# Patient Record
Sex: Male | Born: 1950 | Race: White | Hispanic: No | Marital: Single | Smoking: Former smoker
Health system: Southern US, Community
[De-identification: ages and names within clinical notes are randomized; demographics above are authoritative.]

## PROBLEM LIST (undated history)

## (undated) DIAGNOSIS — I639 Cerebral infarction, unspecified: Secondary | ICD-10-CM

## (undated) DIAGNOSIS — L02416 Cutaneous abscess of left lower limb: Secondary | ICD-10-CM

## (undated) DIAGNOSIS — E785 Hyperlipidemia, unspecified: Secondary | ICD-10-CM

## (undated) DIAGNOSIS — L03116 Cellulitis of left lower limb: Secondary | ICD-10-CM

## (undated) DIAGNOSIS — M109 Gout, unspecified: Secondary | ICD-10-CM

## (undated) DIAGNOSIS — I509 Heart failure, unspecified: Secondary | ICD-10-CM

## (undated) DIAGNOSIS — I2510 Atherosclerotic heart disease of native coronary artery without angina pectoris: Secondary | ICD-10-CM

## (undated) DIAGNOSIS — I10 Essential (primary) hypertension: Secondary | ICD-10-CM

## (undated) HISTORY — PX: CARDIAC CATHETERIZATION: SHX172

## (undated) HISTORY — PX: CLEFT LIP REPAIR: SUR1164

---

## 2003-12-01 ENCOUNTER — Emergency Department: Payer: Self-pay | Admitting: Emergency Medicine

## 2003-12-02 ENCOUNTER — Emergency Department: Payer: Self-pay | Admitting: Emergency Medicine

## 2003-12-02 ENCOUNTER — Other Ambulatory Visit: Payer: Self-pay

## 2003-12-21 ENCOUNTER — Emergency Department: Payer: Self-pay | Admitting: Emergency Medicine

## 2004-01-05 ENCOUNTER — Emergency Department: Payer: Self-pay | Admitting: Internal Medicine

## 2004-06-03 ENCOUNTER — Inpatient Hospital Stay: Payer: Self-pay | Admitting: Internal Medicine

## 2004-06-03 ENCOUNTER — Other Ambulatory Visit: Payer: Self-pay

## 2004-06-03 IMAGING — CT CT CHEST W/ CM
1 series · 15 of 33 positions shown, 19 images · IV contrast (APPLIED)
Comparison: none

REASON FOR EXAM: Chest pain
COMMENTS:

[Series 4: soft tissue · axial · 0.83mm/px · z∈[-400,-118]mm · 15 of 112 slices shown, 19 images]
[im 9/112  mediastinal]
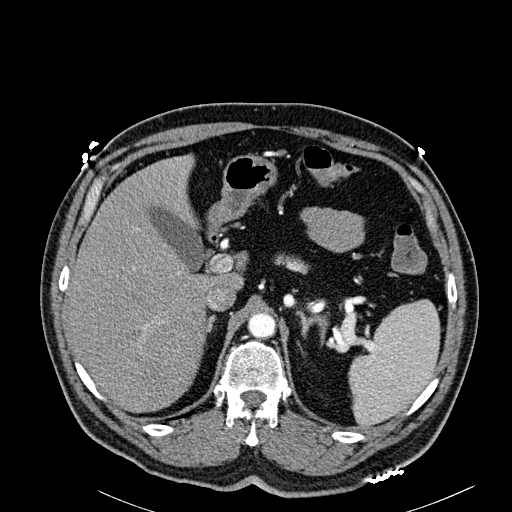
[im 9/112  lung]
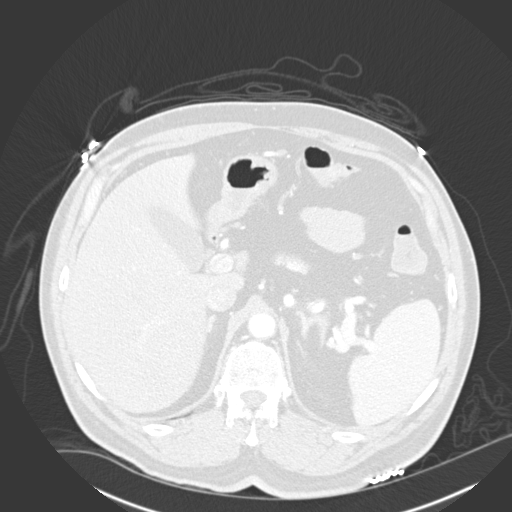
[im 17/112  lung]
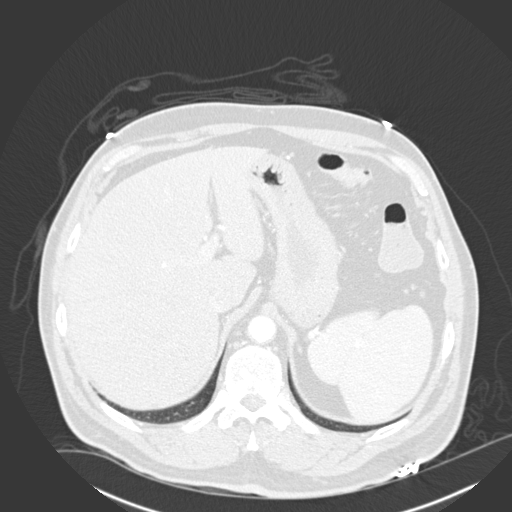
[im 23/112  lung]
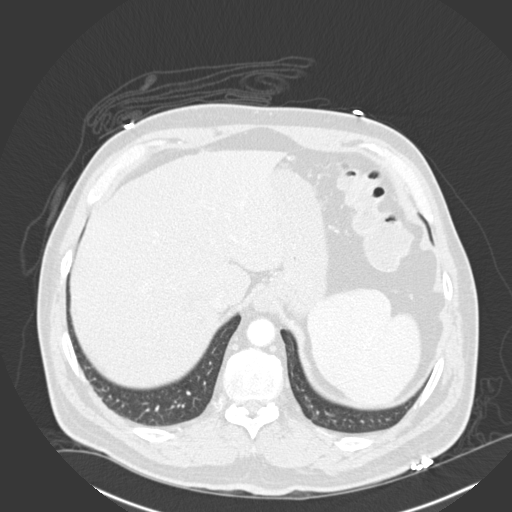
[im 29/112  lung]
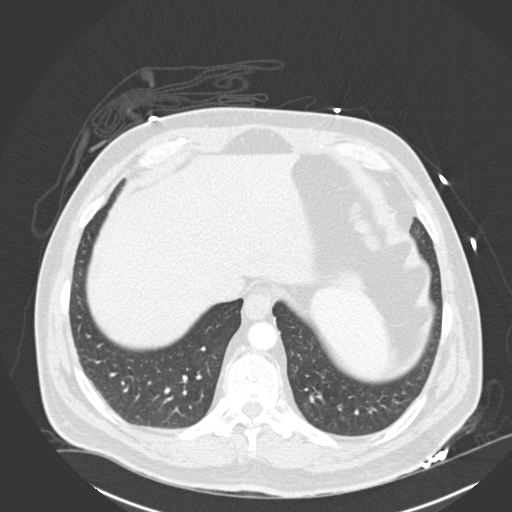
[im 38/112  mediastinal]
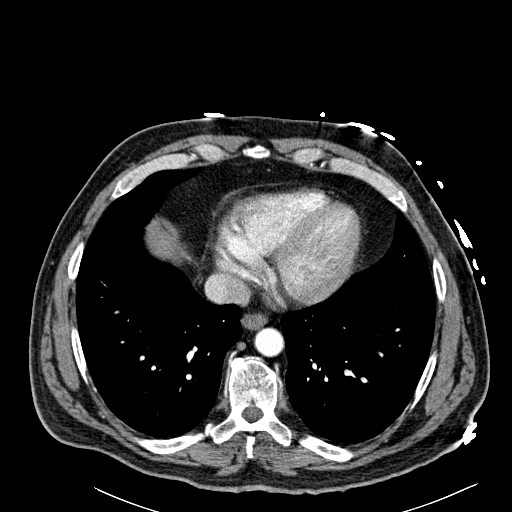
[im 38/112  lung]
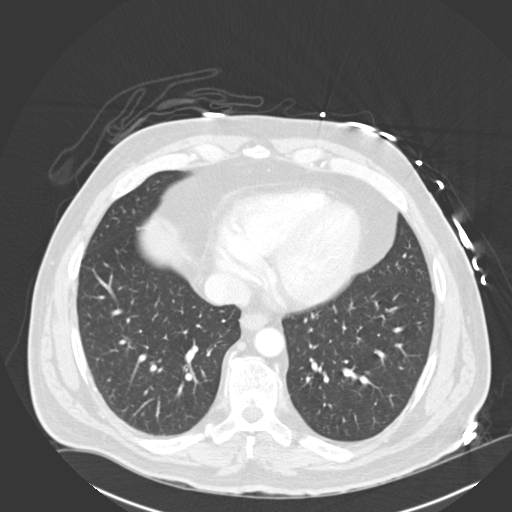
[im 45/112  lung]
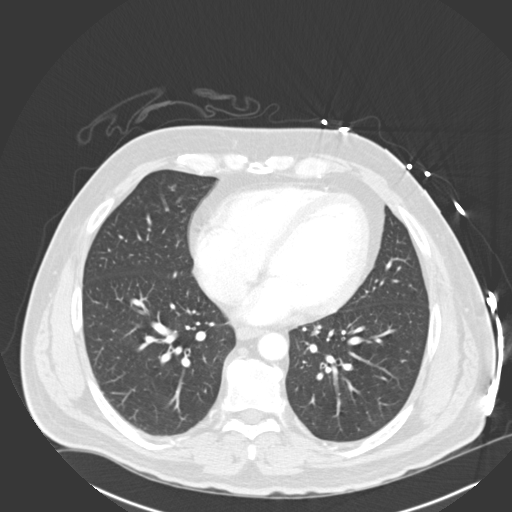
[im 50/112  lung]
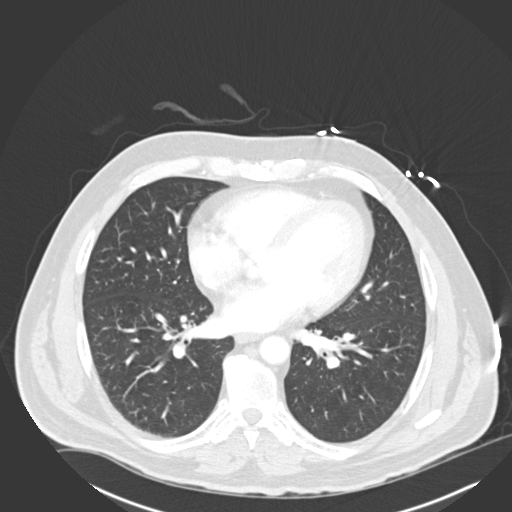
[im 58/112  lung]
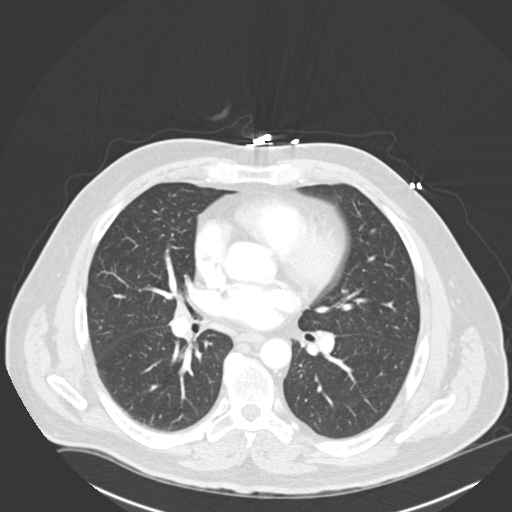
[im 62/112  mediastinal]
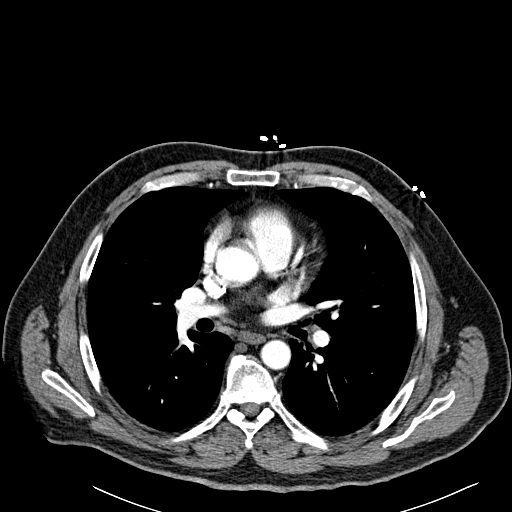
[im 62/112  lung]
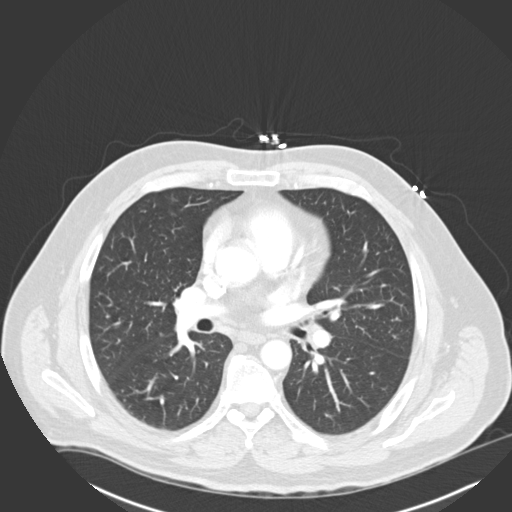
[im 67/112  lung]
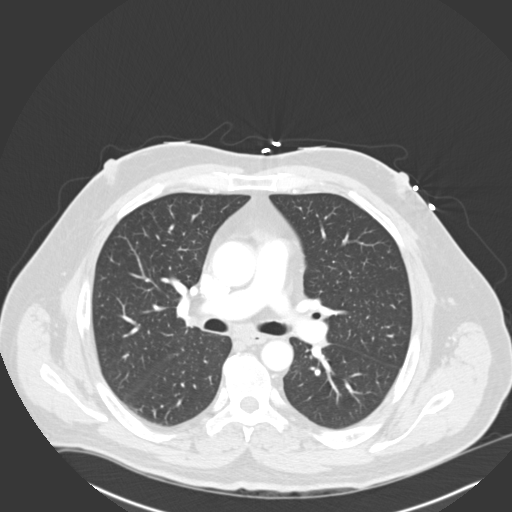
[im 75/112  lung]
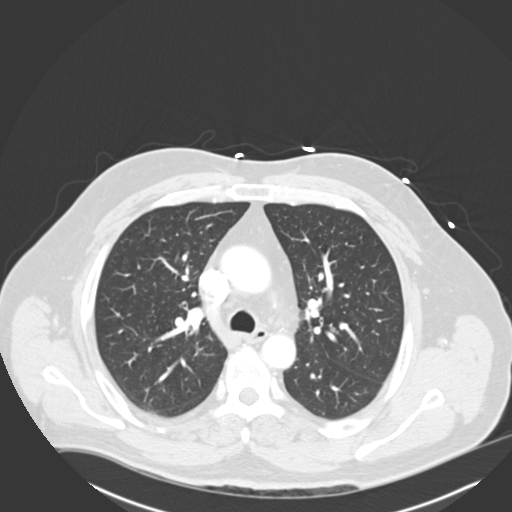
[im 83/112  lung]
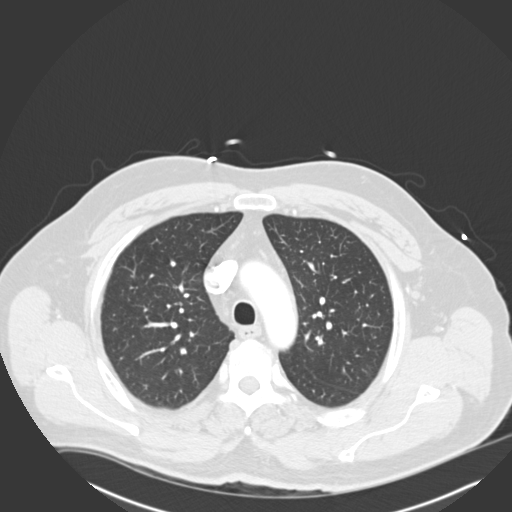
[im 89/112  mediastinal]
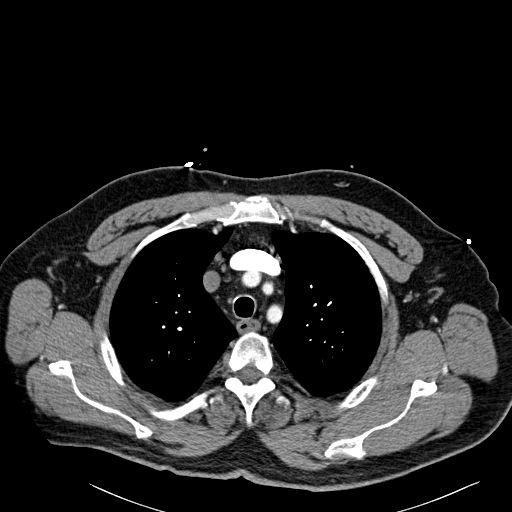
[im 89/112  lung]
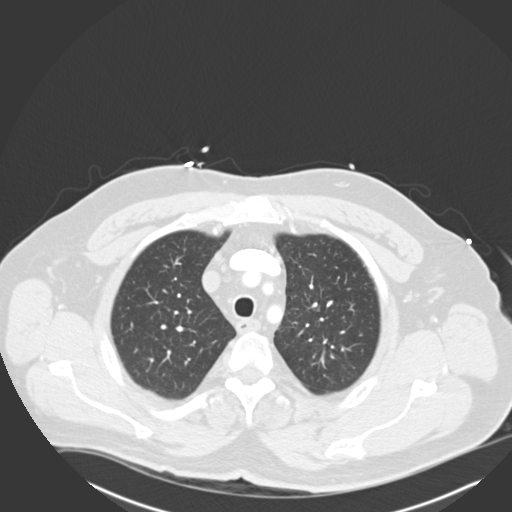
[im 95/112  lung]
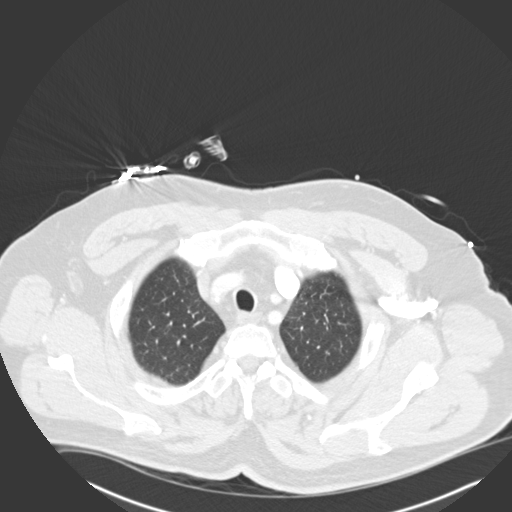
[im 103/112  lung]
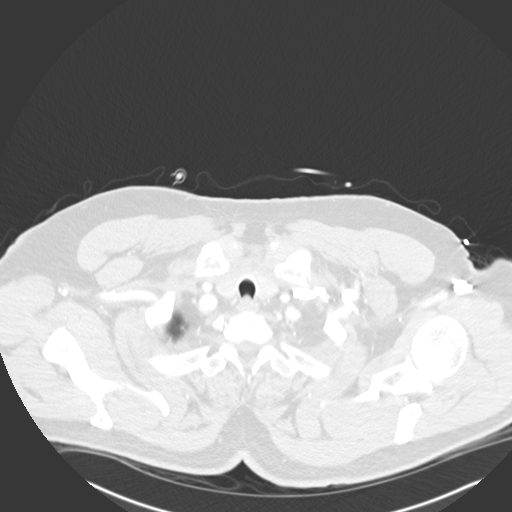

[15 of 33 positions shown; findings below may reference images not displayed]

PROCEDURE:     CT  - CT CHEST (FOR PE) W  - June 03, 2004  [DATE]

RESULT:     This study was tailored to evaluate the patient for acute
pulmonary embolism.  The patient has dyspnea, chest pain, and elevated
D-dimer.  The patient received 100 cc of Nsovue-OEO.  The patient has a
known decreased cardiac output.  The concentration of the contrast material
is less than optimal in the pulmonary arterial tree, but I do not see
findings suspicious for acute pulmonary embolism.  The cardiac chambers are
top normal in size.  I see no pleural or pericardial effusion.  Within the
mediastinum and hilar regions I see no pathologic sized lymph nodes.  The
caliber of the thoracic aorta is normal.  At lung window settings, I see no
interstitial or alveolar infiltrates.

Within the upper abdomen the observed portions of the liver are normal.  The
spleen is partially imaged and is not enlarged.  I see no adrenal masses.
The visualized portions of the gallbladder is normal.
IMPRESSION: I do not see evidence of acute pulmonary embolism.

I see no objective evidence of acute cardiopulmonary disease.

The findings were called to Dr. Hah in the emergency room at the
conclusion of the study.

## 2004-06-03 IMAGING — CR DG CHEST 1V PORT
1 series · 1 of 1 positions shown · non-contrast
Comparison: none

REASON FOR EXAM: Chest pain
COMMENTS:

[view not recorded]
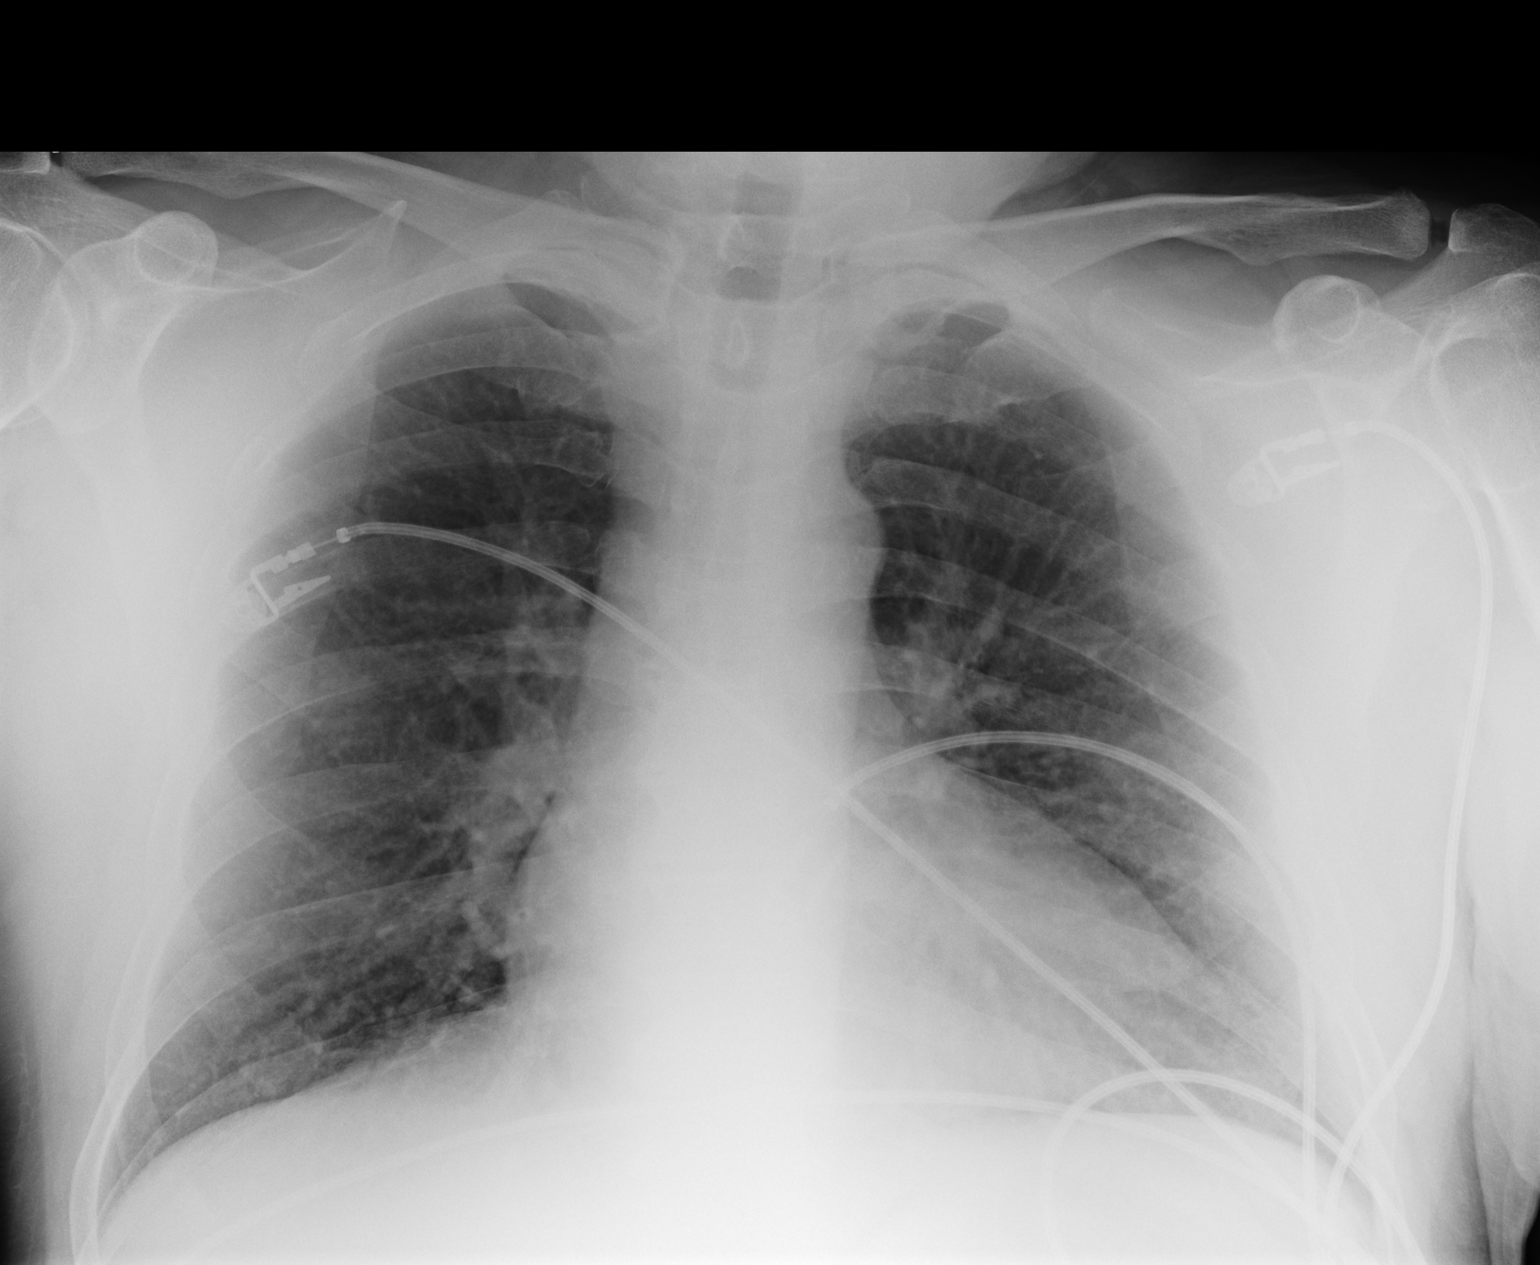

[1 of 1 positions shown; findings below may reference images not displayed]

PROCEDURE:     DXR - DXR PORTABLE CHEST SINGLE VIEW  - June 03, 2004  [DATE]

RESULT:     AP view of the chest is compared to the prior exam of 12-02-03.
The lung fields are clear.  No pneumonia, pneumothorax, or pleural effusion
is seen.  The heart is upper limits for normal in size.  Monitoring
electrodes are present.
IMPRESSION: The lung fields are clear.

Heart is upper limits for normal in size.

No pulmonary edema or pleural effusion is seen.

## 2004-06-26 ENCOUNTER — Emergency Department: Payer: Self-pay | Admitting: Emergency Medicine

## 2004-07-05 ENCOUNTER — Other Ambulatory Visit: Payer: Self-pay

## 2004-07-05 ENCOUNTER — Emergency Department: Payer: Self-pay | Admitting: Emergency Medicine

## 2004-07-05 IMAGING — CR DG CHEST 2V
1 series · 2 of 2 positions shown · non-contrast
Comparison: none

REASON FOR EXAM: chest pain
COMMENTS:  LMP: (Male)

PROCEDURE:     DXR - DXR CHEST PA (OR AP) AND LATERAL  - July 05, 2004  [DATE]
RESULT:        The lung fields are clear.  No pneumonia, pneumothorax or
pleural effusion is seen.   The heart size is normal.  Degenerative spurring
is noted at multiple levels of the thoracic spine.

[Series 1: view not recorded · 0.17mm/px · 2 of 2 slices shown]
[im 1/2]
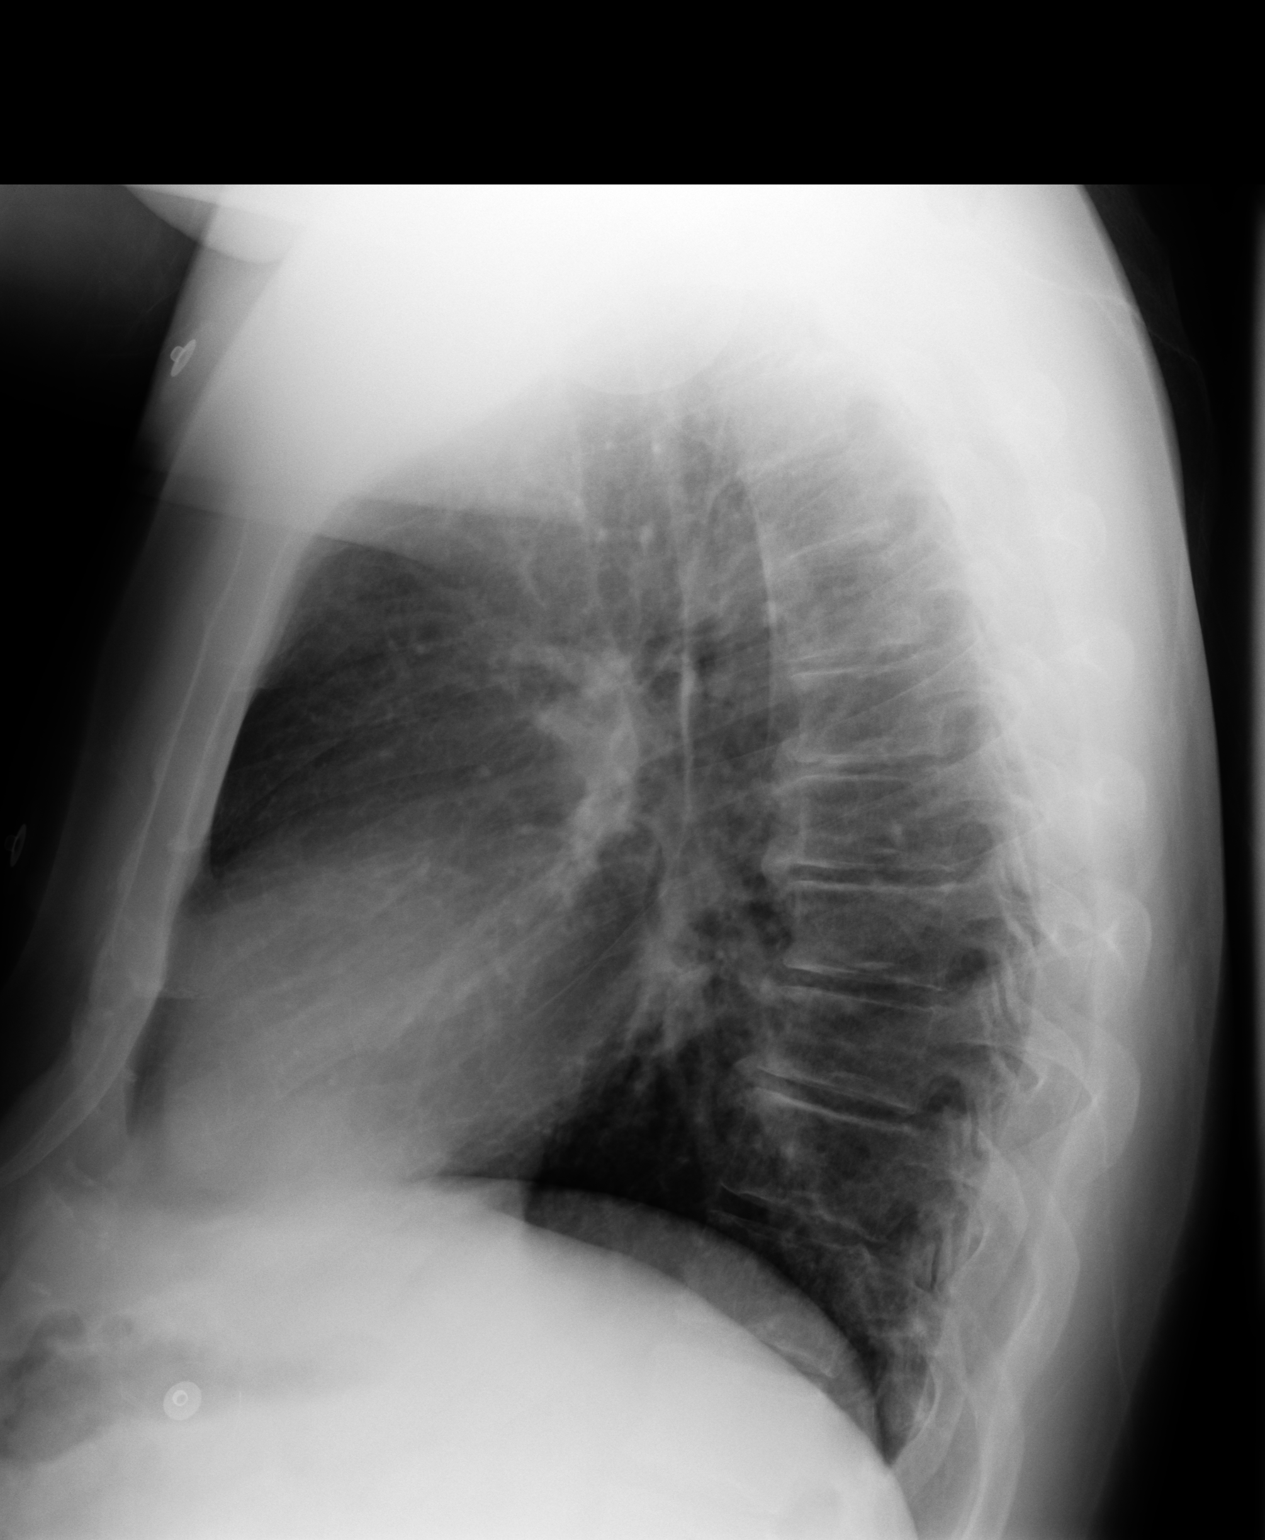
[im 2/2]
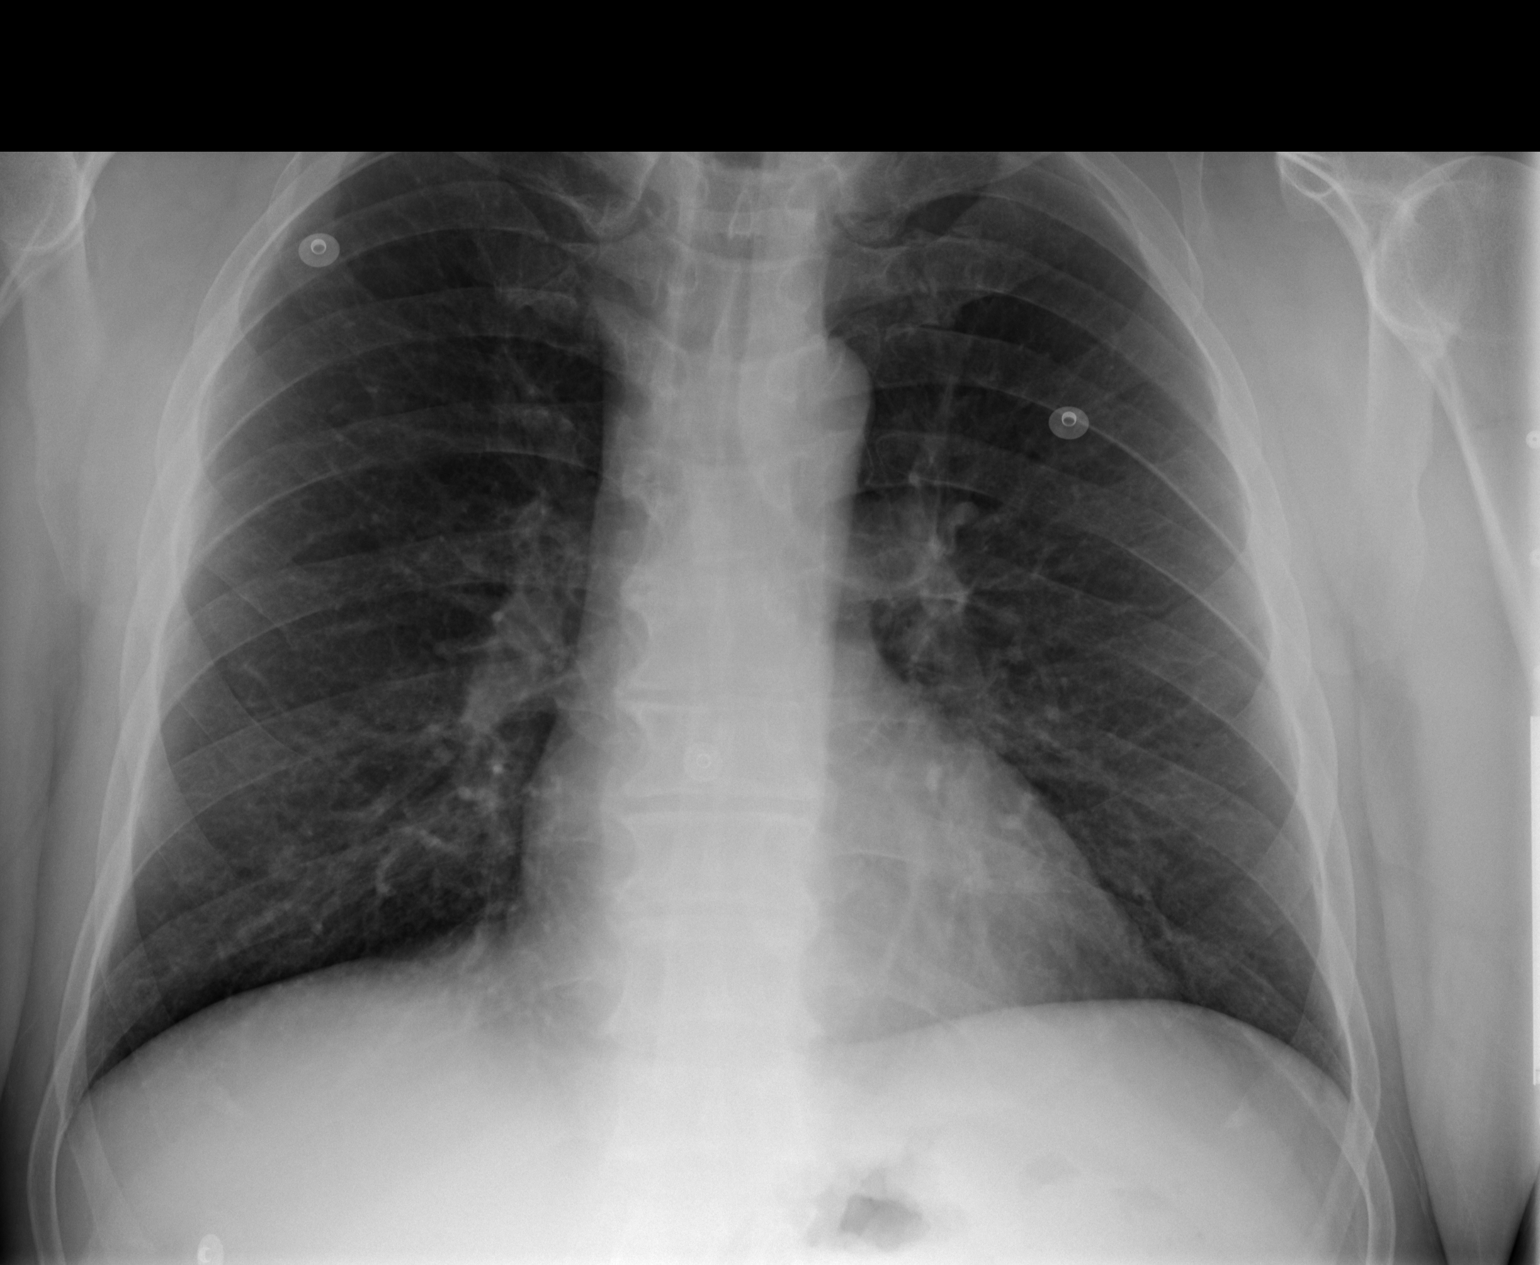

[2 of 2 positions shown; findings below may reference images not displayed]

IMPRESSION: No acute changes are identified.

## 2005-01-18 ENCOUNTER — Emergency Department: Payer: Self-pay | Admitting: Emergency Medicine

## 2005-02-06 ENCOUNTER — Observation Stay: Payer: Self-pay | Admitting: Internal Medicine

## 2005-02-06 ENCOUNTER — Other Ambulatory Visit: Payer: Self-pay

## 2005-02-06 IMAGING — CT CT HEAD WITHOUT CONTRAST
2 series · 16 of 30 positions shown, 20 images · non-contrast
Comparison: none

REASON FOR EXAM: LEFT arm numbness
COMMENTS:

[Series 2: without · axial · non-contrast · 0.45mm/px · z∈[-169,-34]mm · 13 of 33 slices shown, 17 images]
[im 3/33  brain]
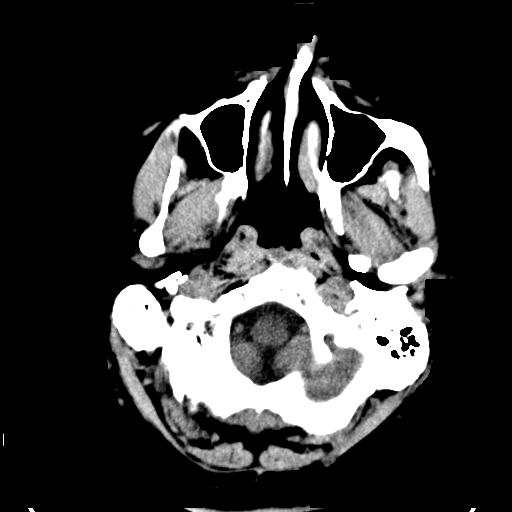
[im 3/33  bone]
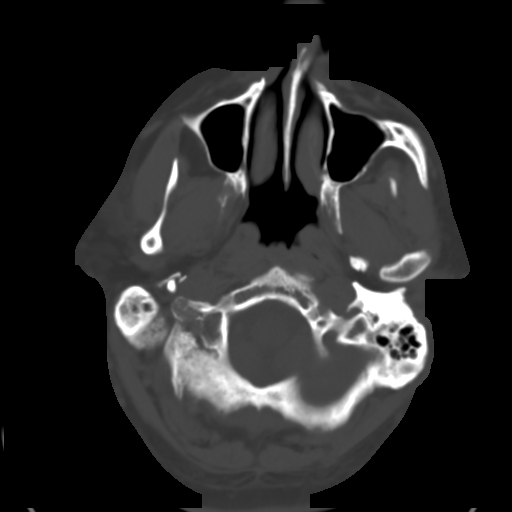
[im 5/33  brain]
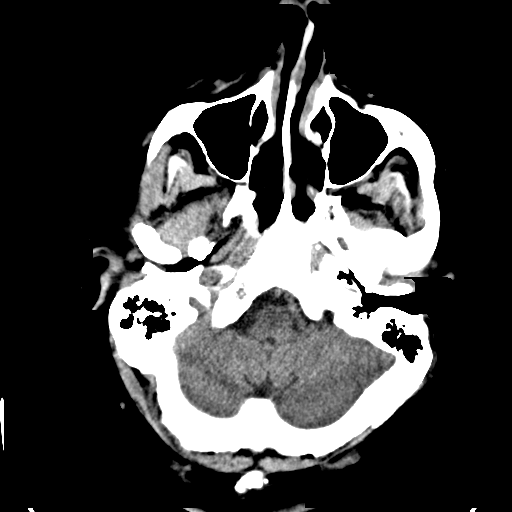
[im 7/33  brain]
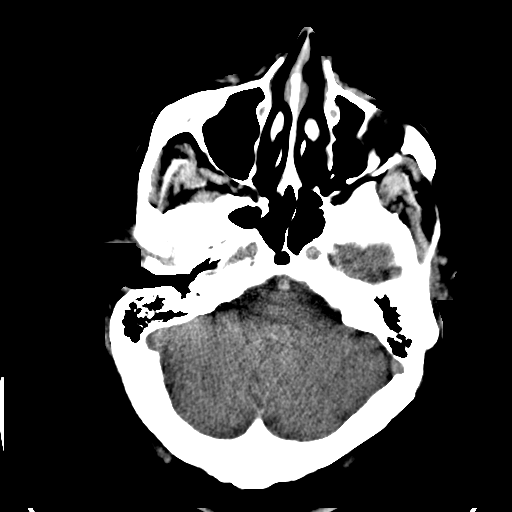
[im 10/33  brain]
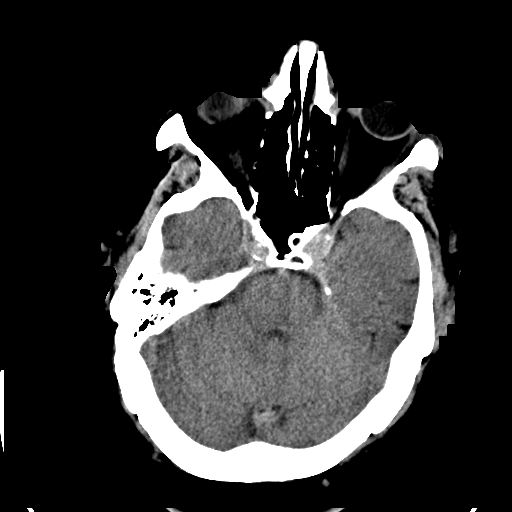
[im 12/33  brain]
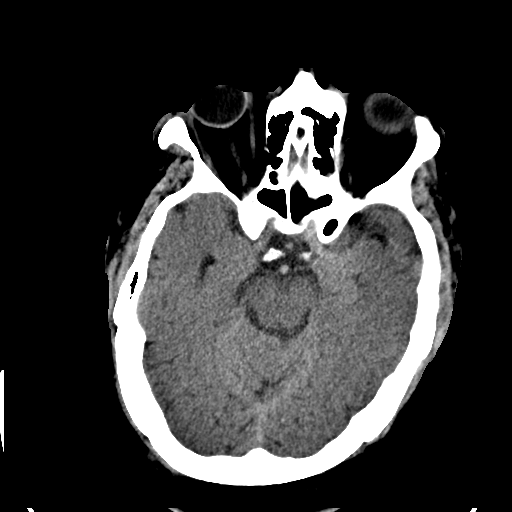
[im 12/33  bone]
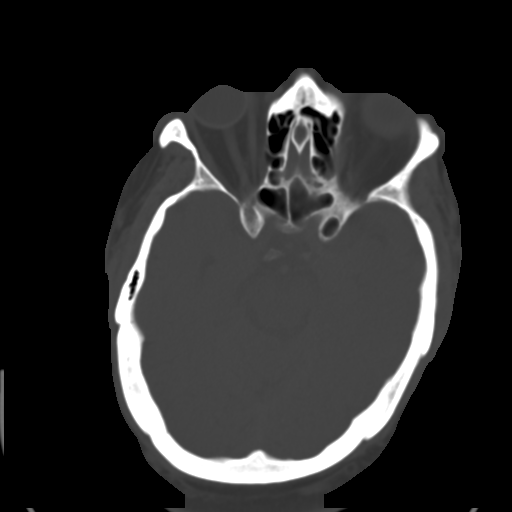
[im 14/33  brain]
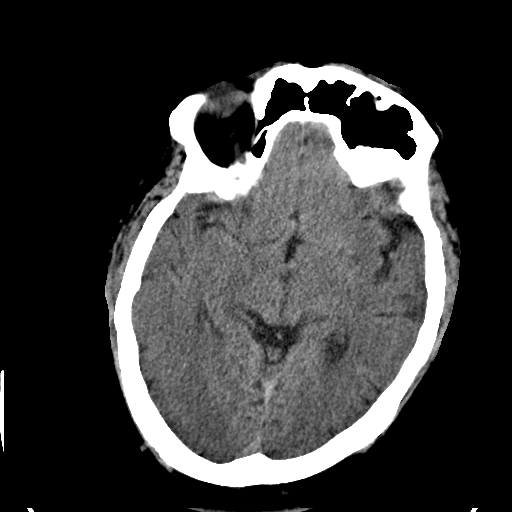
[im 17/33  brain]
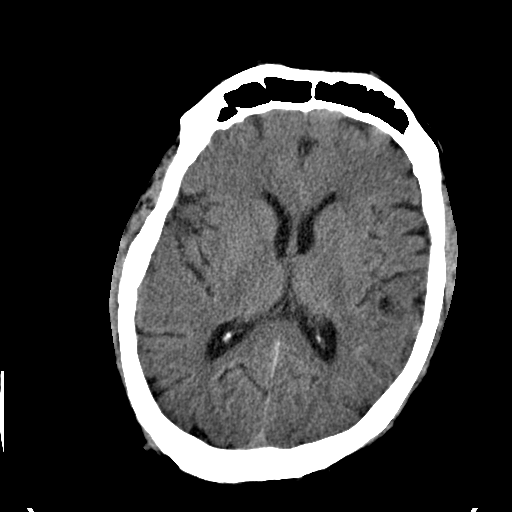
[im 19/33  brain]
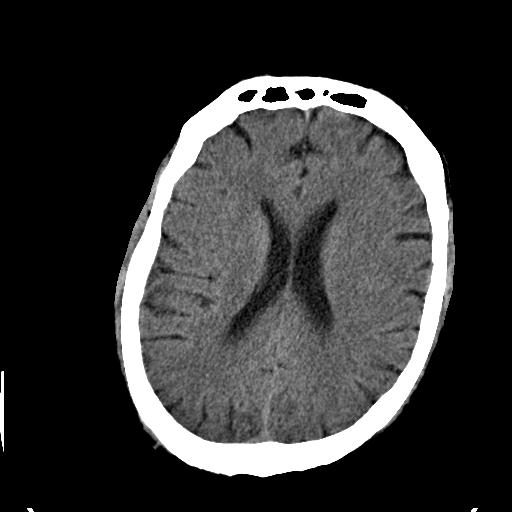
[im 21/33  brain]
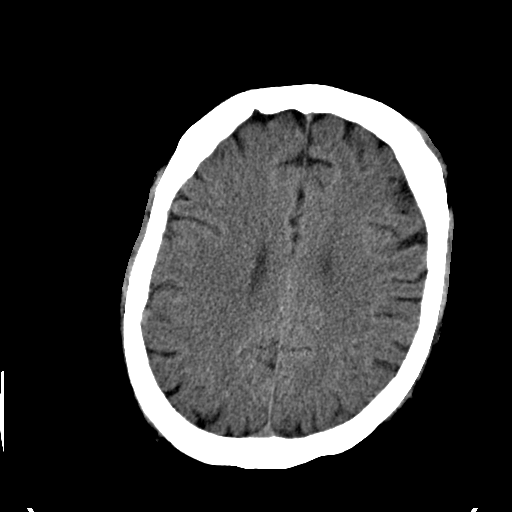
[im 21/33  bone]
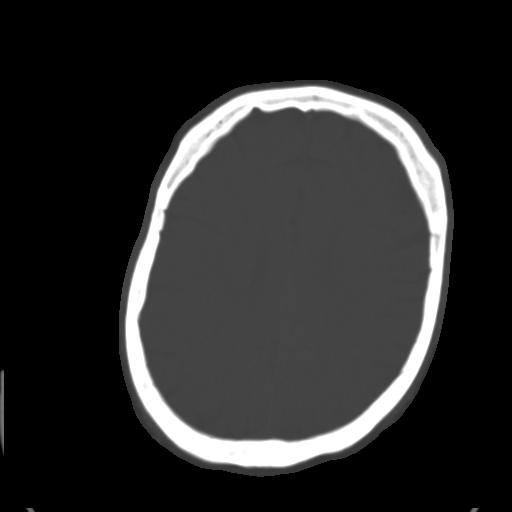
[im 23/33  brain]
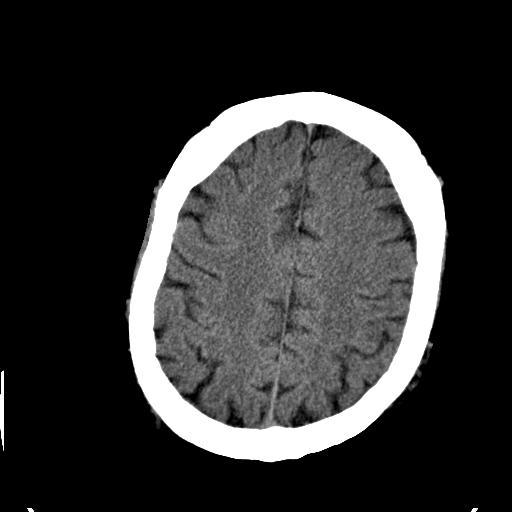
[im 26/33  brain]
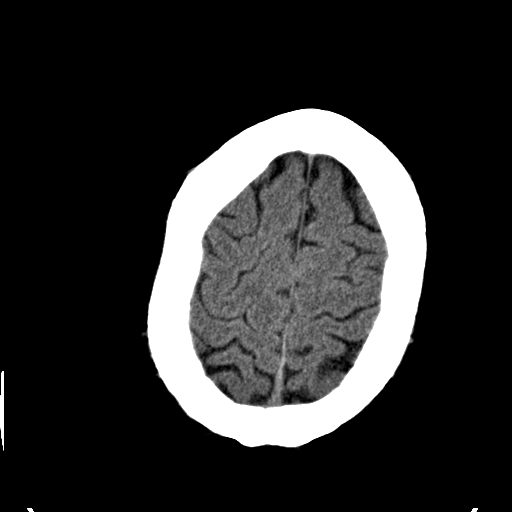
[im 28/33  brain]
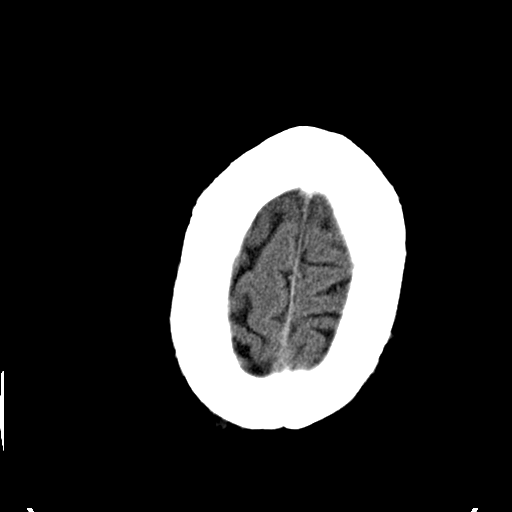
[im 30/33  brain]
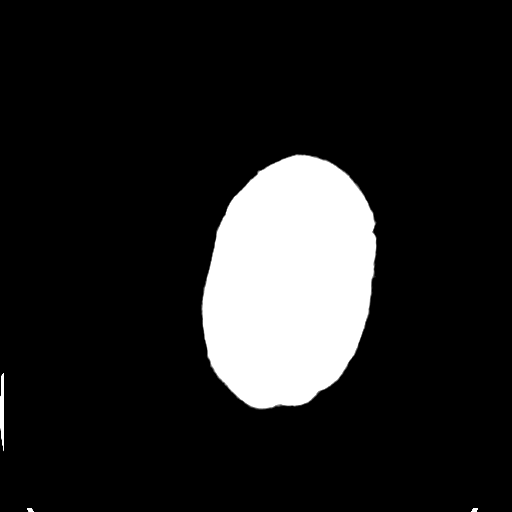
[im 30/33  bone]
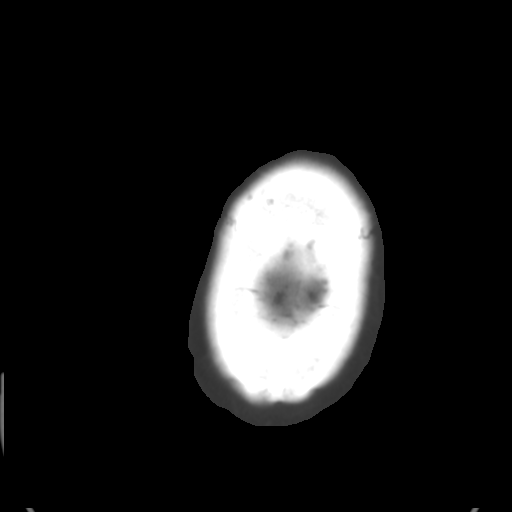

[Series 3: bone · axial · 0.45mm/px · z∈[-169,-124]mm · 3 of 33 slices shown]
[im 3/33  bone]
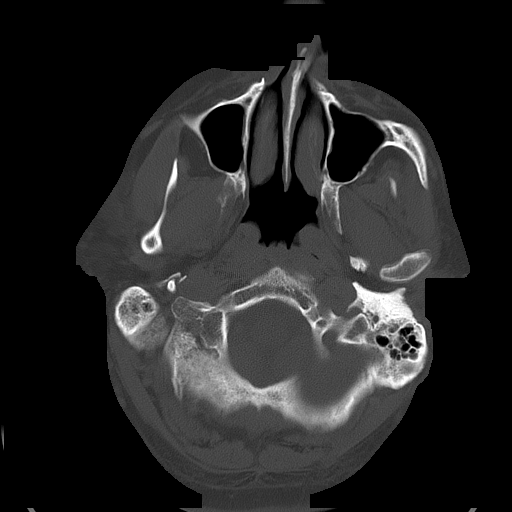
[im 7/33  bone]
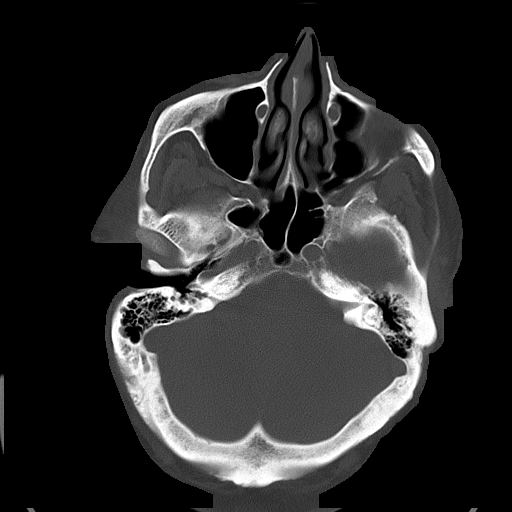
[im 12/33  bone]
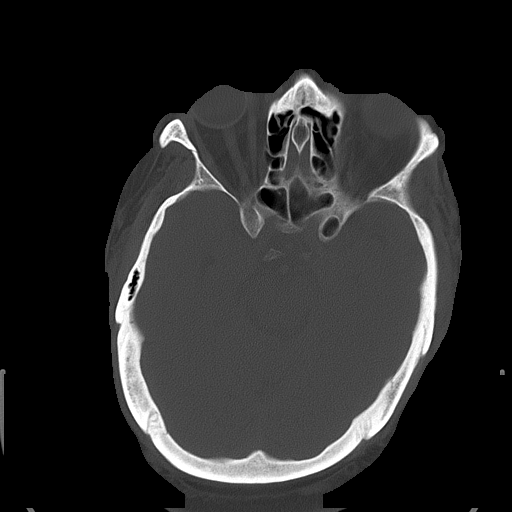

[16 of 30 positions shown; findings below may reference images not displayed]

PROCEDURE:     CT  - CT HEAD WITHOUT CONTRAST  - February 06, 2005 [DATE]

RESULT:          An unenhanced emergent head CT was performed for LEFT arm
numbness.

There are no acute bleeds, no acute infarcts, no mass effect, no shift of
the midline.  The ventricles appear within normal limits.  No extraaxial
fluid collections are noted.  On the bone window settings, the sinuses are
clear.
IMPRESSION: No significant abnormality is noted on the unenhanced
head CT.

The report was called to Dr. Amazigh.

## 2005-02-06 IMAGING — CR DG CHEST 1V PORT
1 series · 1 of 1 positions shown · non-contrast
Comparison: none

REASON FOR EXAM: Chest pain
COMMENTS:

PROCEDURE:     DXR - DXR PORTABLE CHEST SINGLE VIEW  - February 06, 2005 [DATE]
RESULT:          AP portable exam was obtained and compared to PA and
lateral of 07/05/2004.
The heart is top normal in size.  The lung fields appear clear.  The
vascularity is within normal limits with no effusions.

[view not recorded]
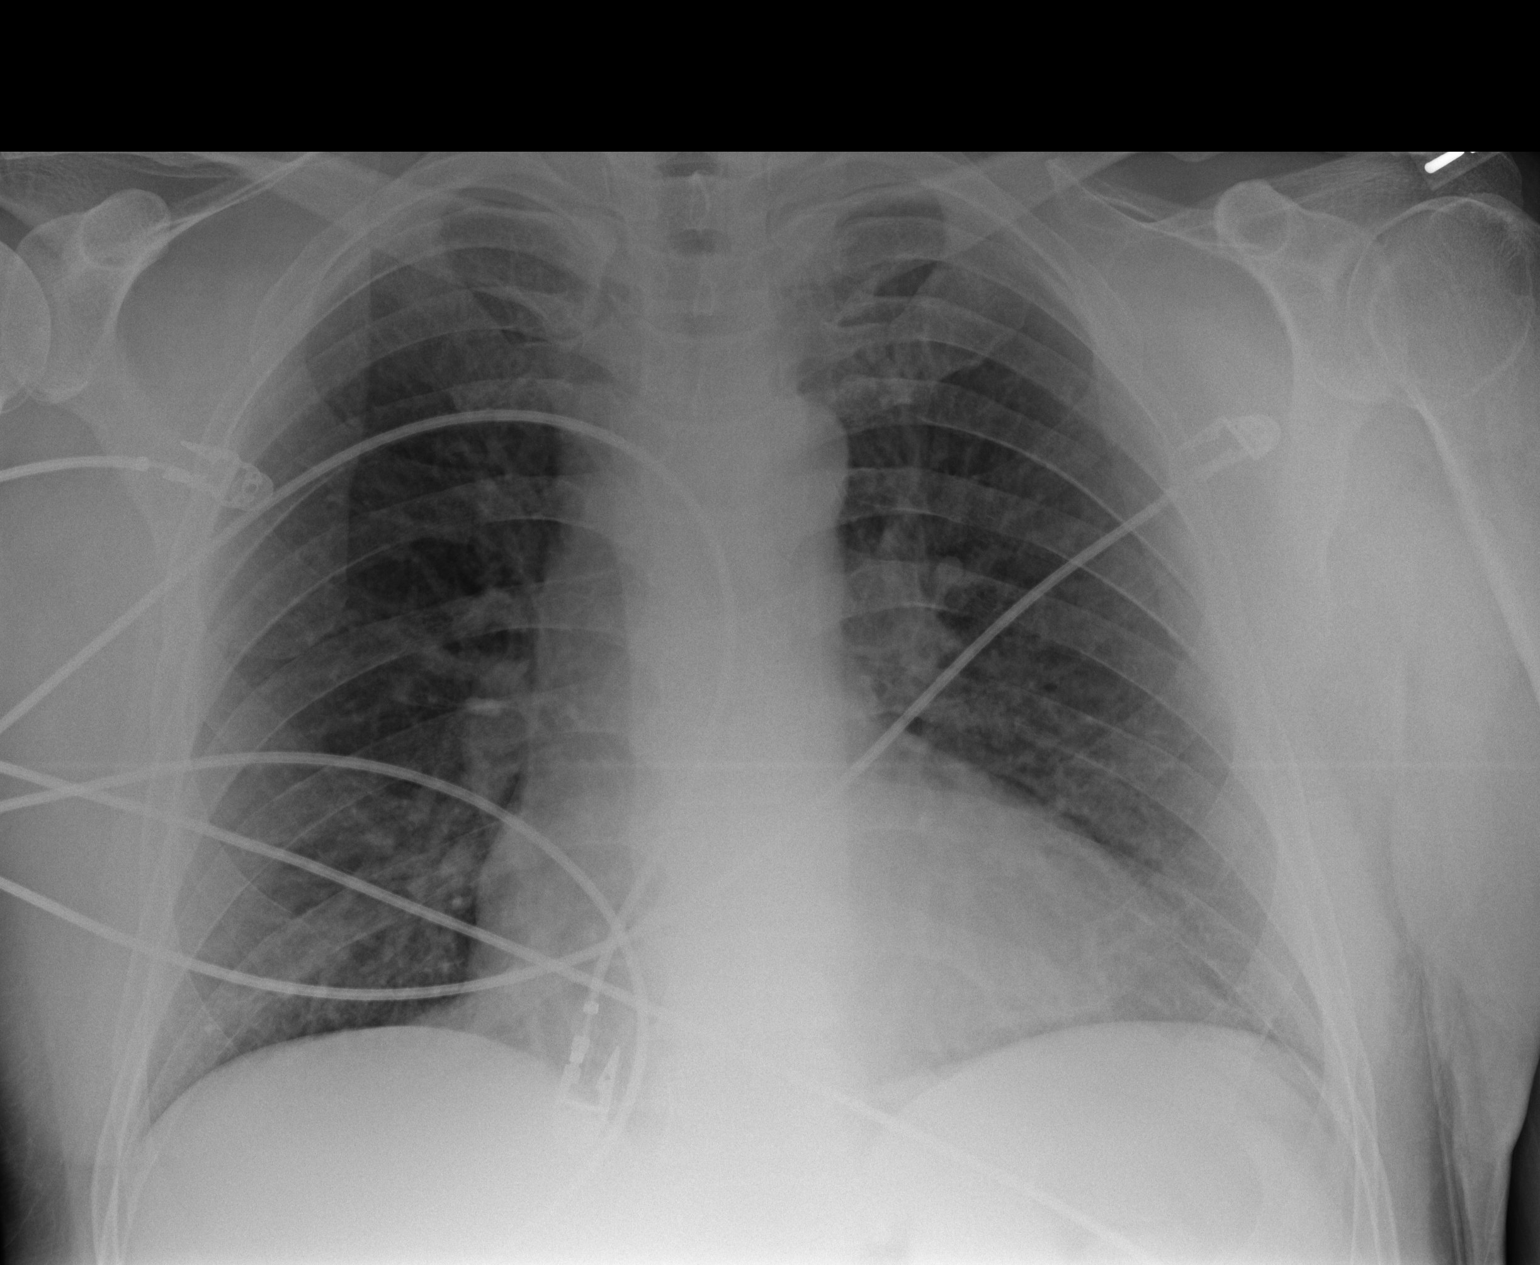

[1 of 1 positions shown; findings below may reference images not displayed]

IMPRESSION: The heart is top normal in size.  The lung fields are
clear.

## 2005-02-07 ENCOUNTER — Other Ambulatory Visit: Payer: Self-pay

## 2006-12-10 ENCOUNTER — Emergency Department: Payer: Self-pay | Admitting: Emergency Medicine

## 2008-05-19 ENCOUNTER — Observation Stay: Payer: Self-pay | Admitting: Internal Medicine

## 2008-05-19 IMAGING — CR DG CHEST 2V
1 series · 3 of 3 positions shown · non-contrast
Comparison: none

REASON FOR EXAM: cp
COMMENTS:

PROCEDURE:     DXR - DXR CHEST PA (OR AP) AND LATERAL  - May 19, 2008 [DATE]
RESULT:     There is mild flattening of the hemidiaphragms. No focal regions
of consolidation or focal infiltrates are appreciated. The cardiac
silhouette is mild to moderately enlarged.

[Series 1: view not recorded · 0.17mm/px · 3 of 3 slices shown]
[im 1/3]
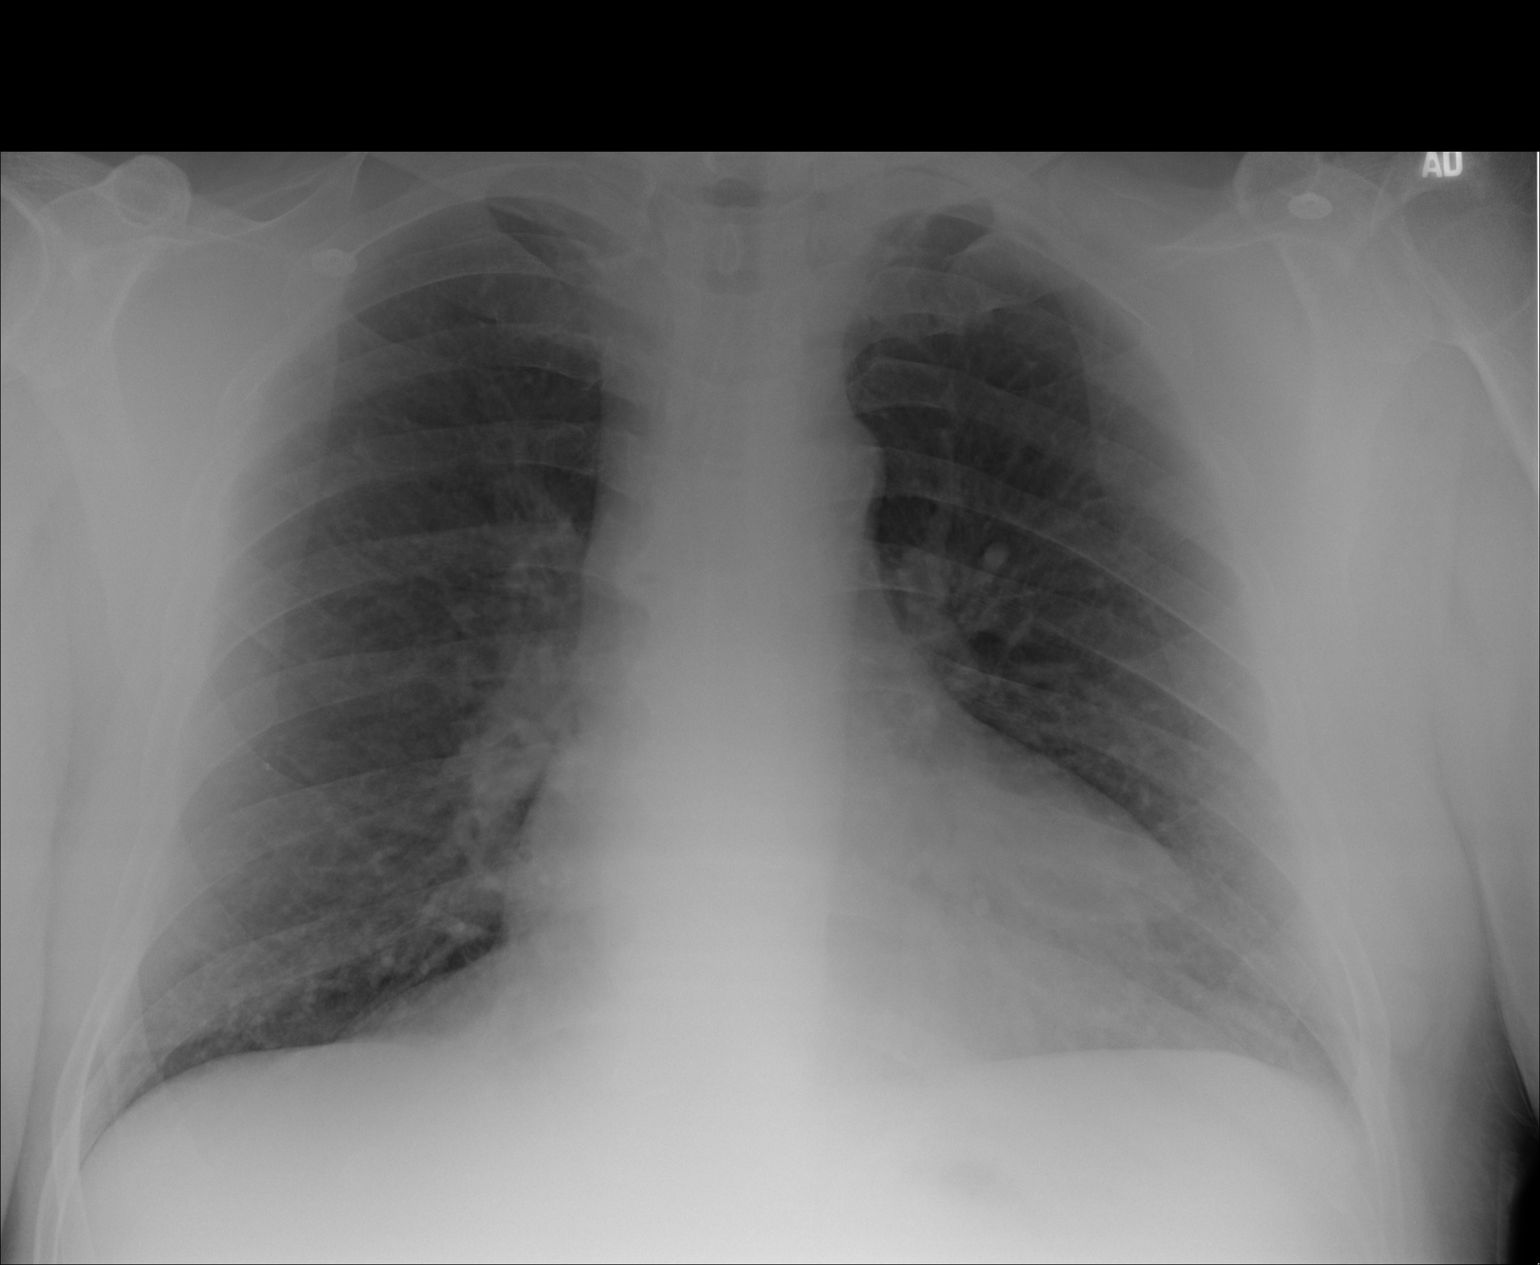
[im 2/3]
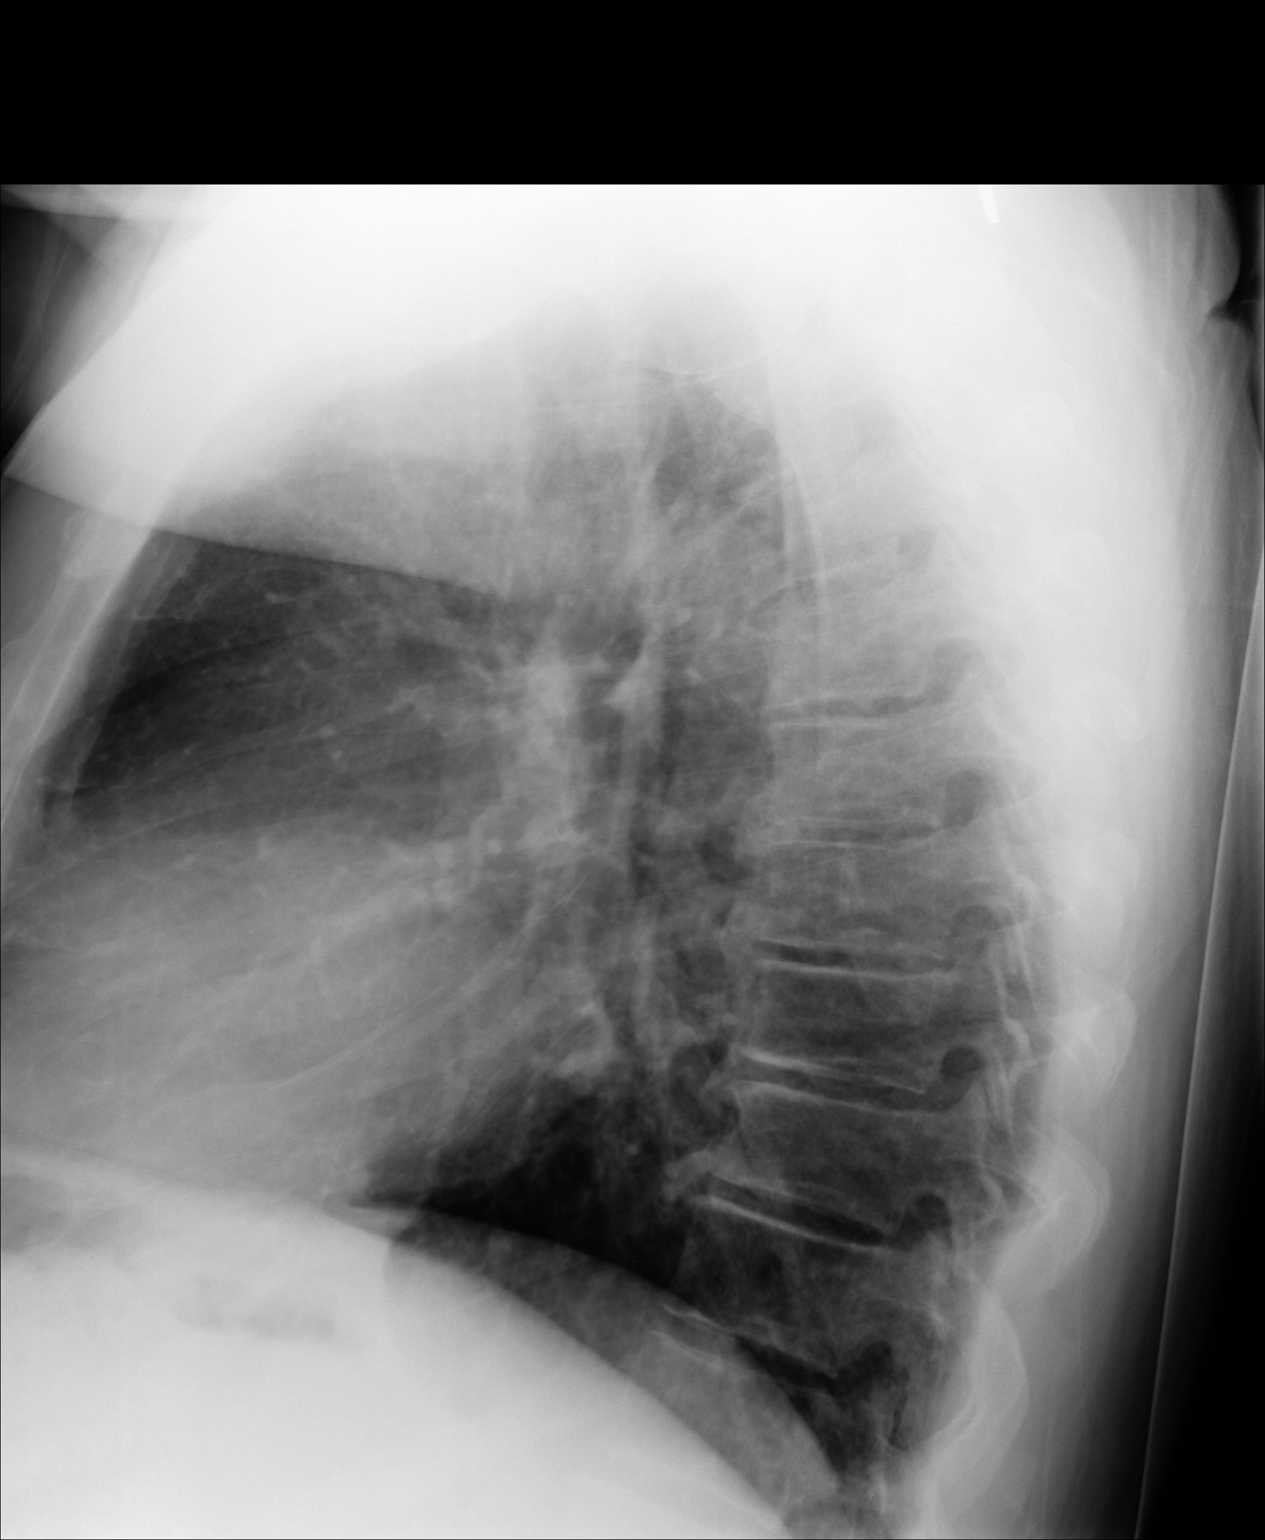
[im 3/3]
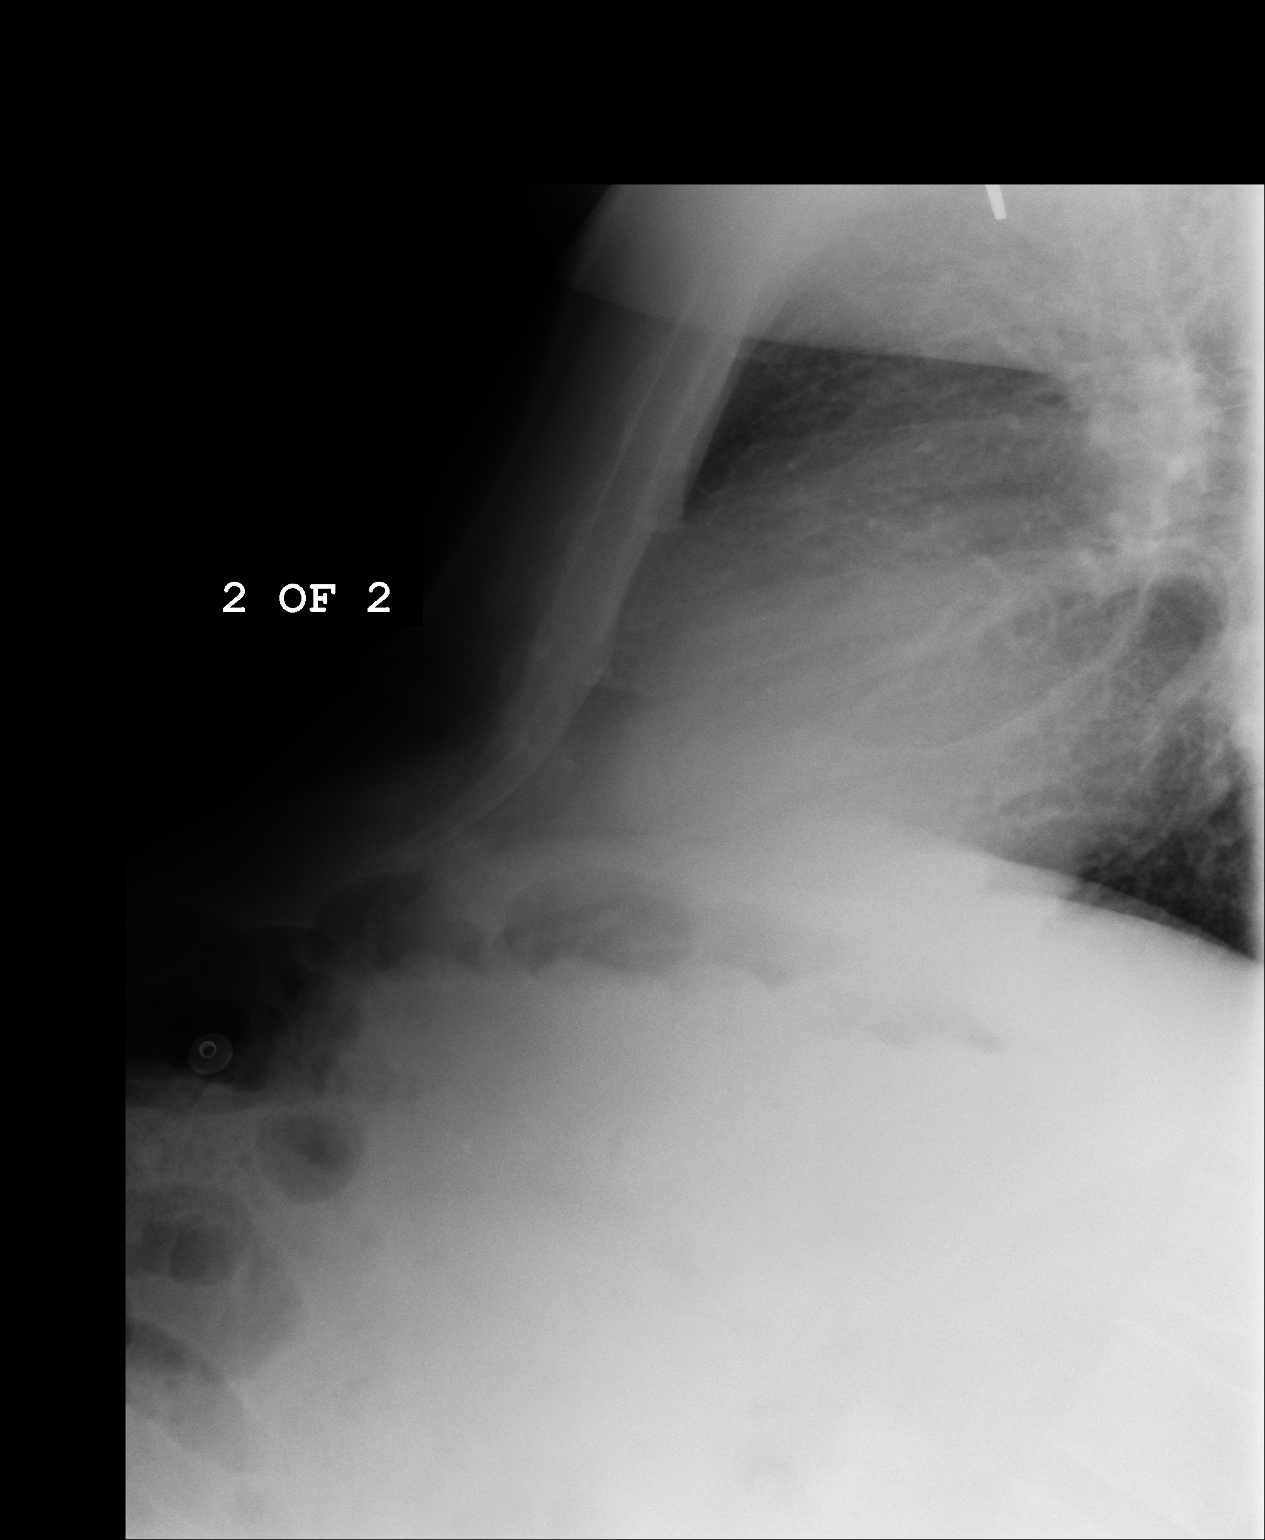

[3 of 3 positions shown; findings below may reference images not displayed]

IMPRESSION: 1. COPD without evidence of acute cardiopulmonary disease.

## 2010-01-07 ENCOUNTER — Emergency Department: Payer: Self-pay | Admitting: Emergency Medicine

## 2010-01-07 IMAGING — CT CT ABD-PELV W/O CM
1 of 2 series · 15 of 32 positions shown, 19 images · non-contrast
Comparison: none

REASON FOR EXAM: (1) right flank pain; (2) right flank pain
COMMENTS:   May transport without cardiac monitor

[Series 2: stone · axial · 0.84mm/px · z∈[-1176,-687]mm · 15 of 179 slices shown, 19 images]
[im 8/179  soft-tissue]
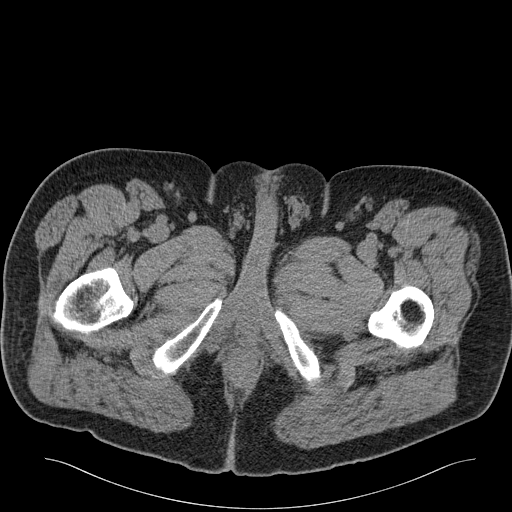
[im 8/179  bone]
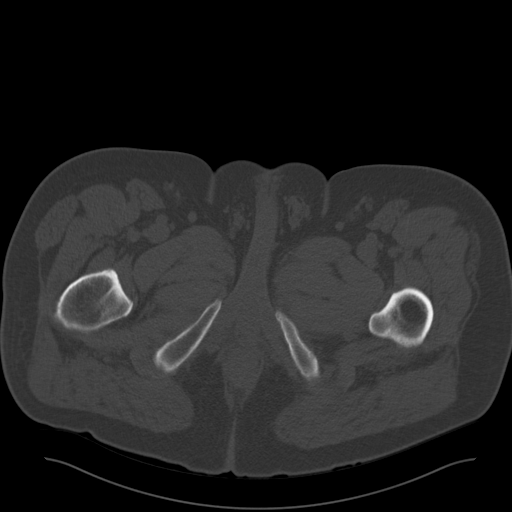
[im 22/179  soft-tissue]
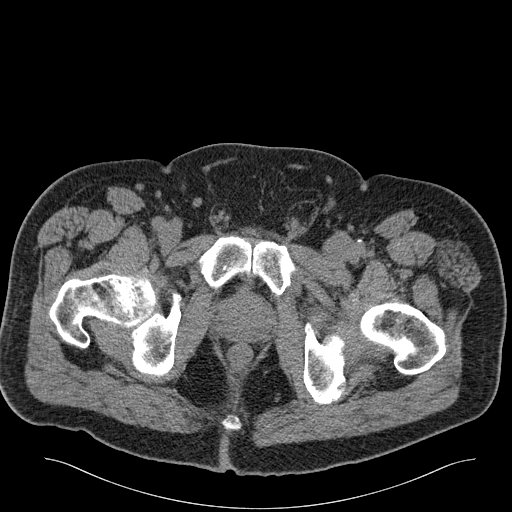
[im 36/179  soft-tissue]
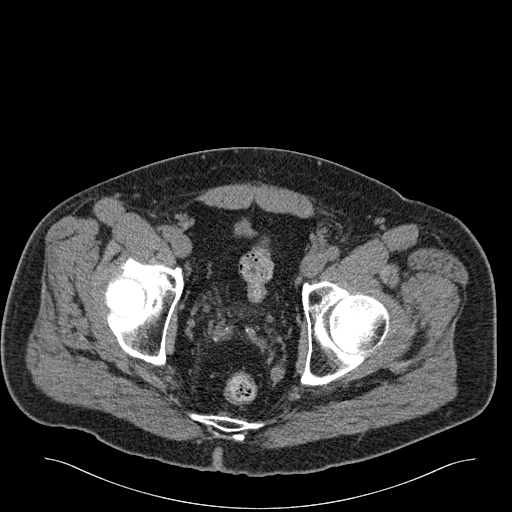
[im 50/179  soft-tissue]
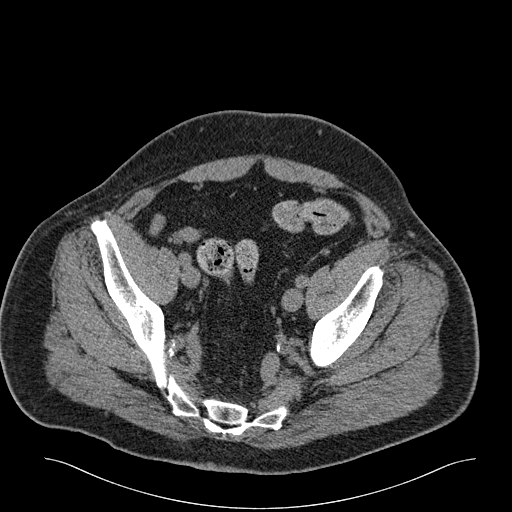
[im 65/179  soft-tissue]
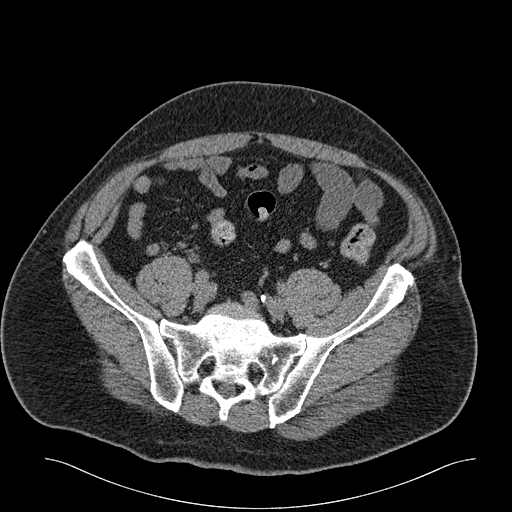
[im 79/179  soft-tissue]
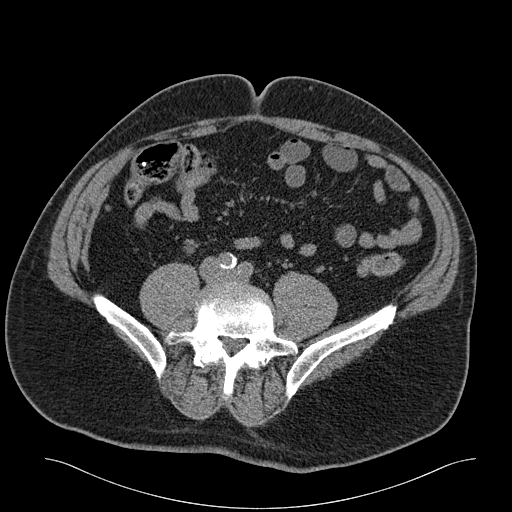
[im 93/179  soft-tissue]
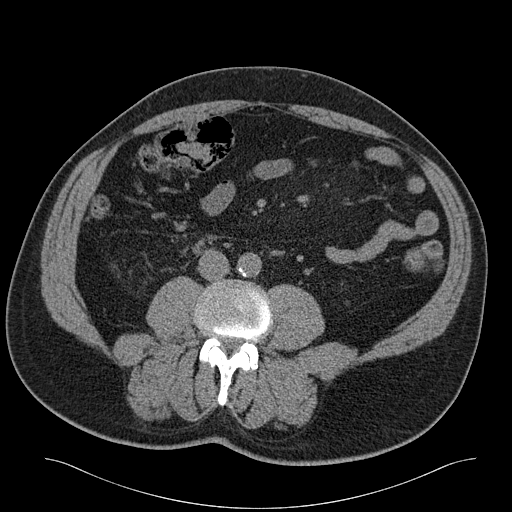
[im 100/179  soft-tissue]
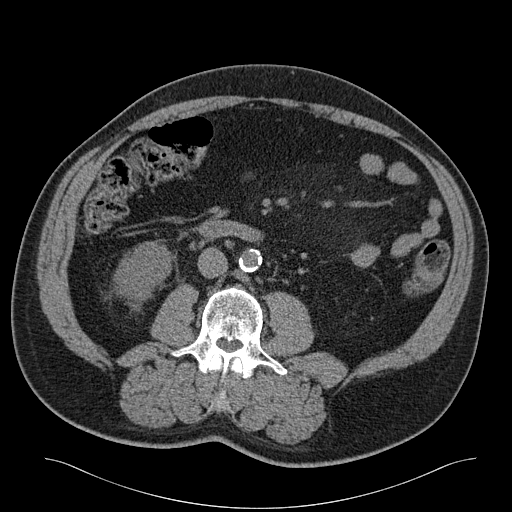
[im 114/179  soft-tissue]
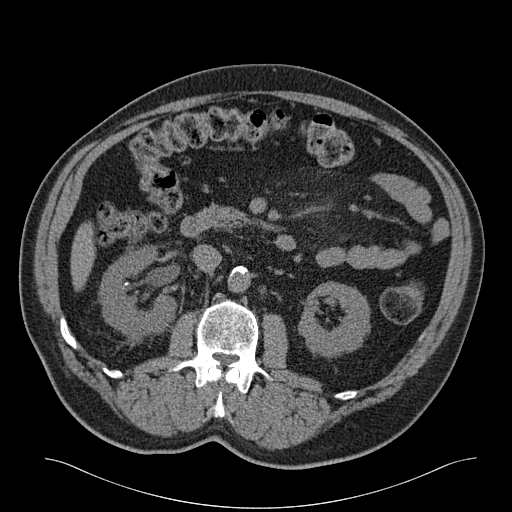
[im 114/179  bone]
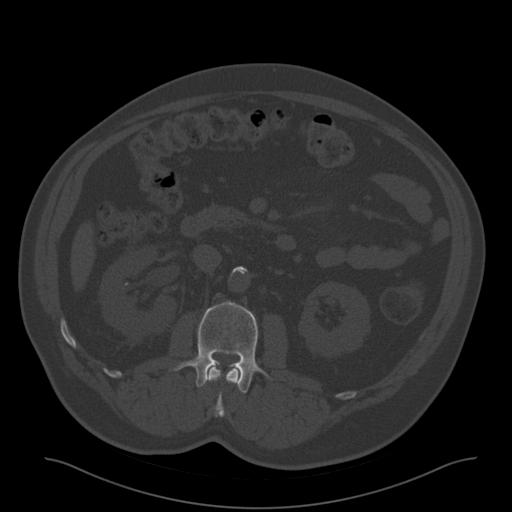
[im 129/179  soft-tissue]
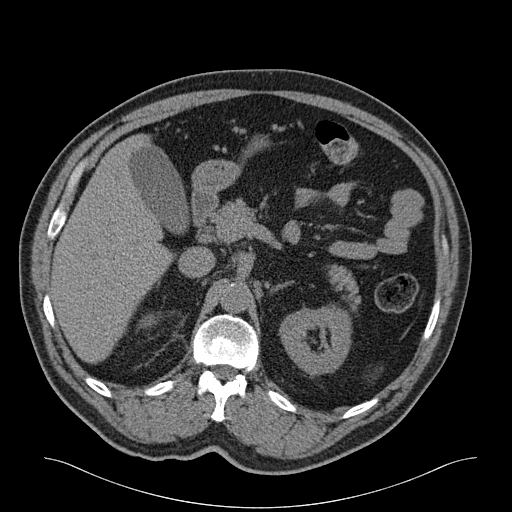
[im 143/179  soft-tissue]
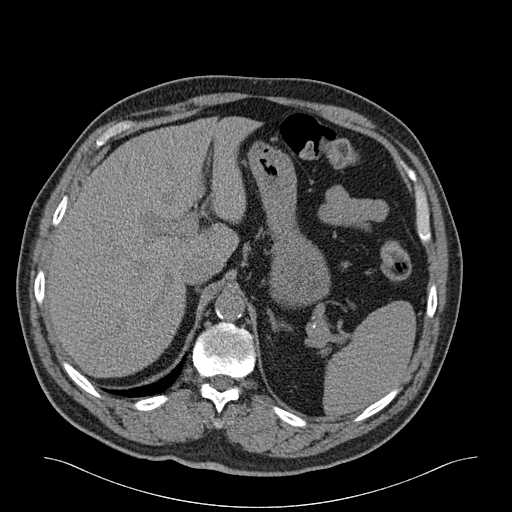
[im 150/179  lung]
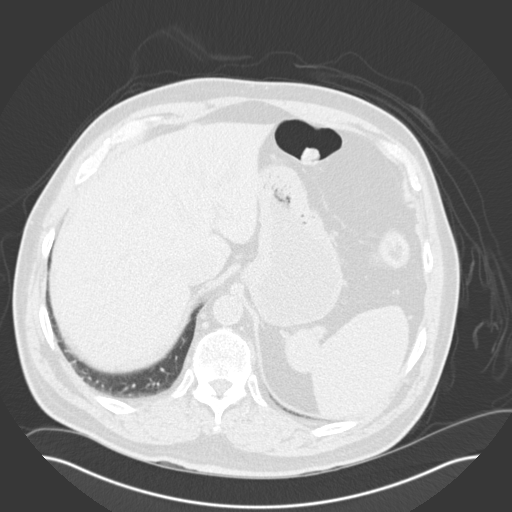
[im 157/179  soft-tissue]
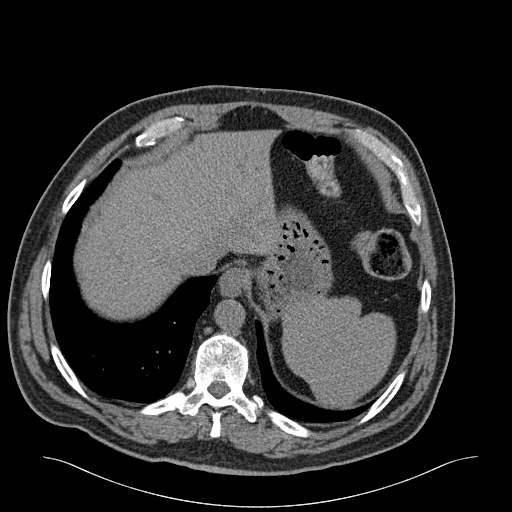
[im 157/179  lung]
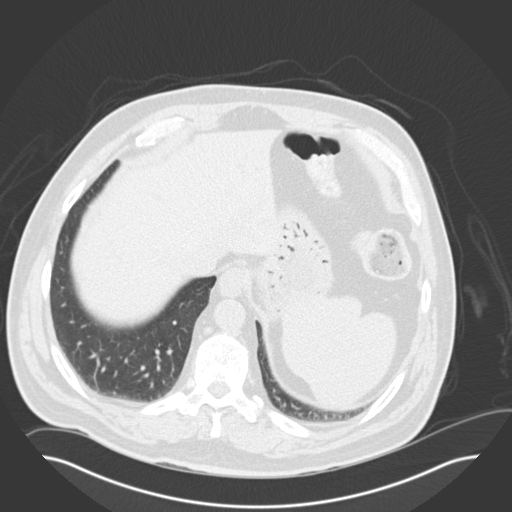
[im 164/179  lung]
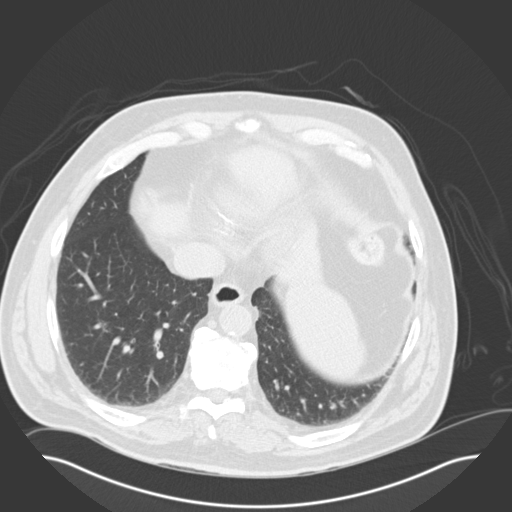
[im 171/179  soft-tissue]
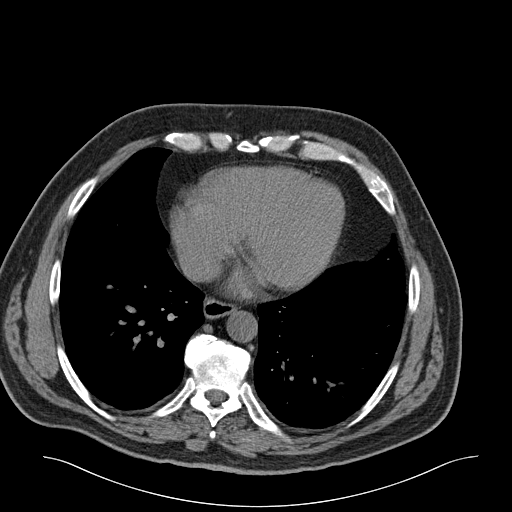
[im 171/179  lung]
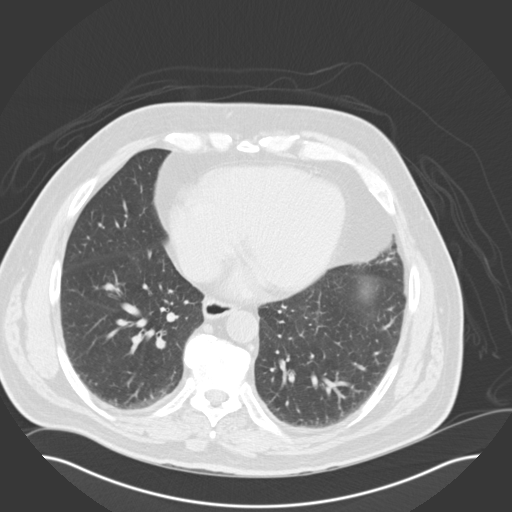

[15 of 32 positions shown; findings below may reference images not displayed]

PROCEDURE:     CT  - CT ABDOMEN AND PELVIS W[DATE]  [DATE]

RESULT:     Axial noncontrast CT scanning was performed through the abdomen
and pelvis at 3 mm intervals and slice thicknesses. Review of multiplanar
reconstructed images was performed separately on the [employer] monitor.

There is mild hydronephrosis and hydroureter on the right secondary to an
approximately 3 mm diameter distal right ureteral stone. The stone is
visible on image 146. There are at least 5 other stones in the right kidney
which measure 3 mm in diameter or less. The left kidney exhibits no evidence
of obstruction. It contains at least 3 to 4 stones measuring less than 2 mm
in diameter.

The liver, gallbladder, pancreas, spleen, partially distended stomach, and
adrenal glands are normal in appearance. The caliber of the abdominal aorta
is normal. The unopacified loops of small and large bowel also appear
normal. The urinary bladder and prostate gland exhibit no acute abnormality.
There is a small left inguinal hernia containing only fat. The lung bases
are clear. The lumbar vertebral bodies are preserved in height.
IMPRESSION: 1. There is mild hydronephrosis on the right secondary to a 3 mm diameter
stone in the distal right ureter. Both kidneys exhibit other nonobstructing
stones.
2. I do not see evidence of acute hepatobiliary abnormality nor acute bowel
abnormality.
3. Not mentioned above is subtle nonspecific increased density in the
mesenteric fat in the left mid abdomen which is a nonspecific finding. I do
not see evidence of an abscess.

## 2019-01-10 ENCOUNTER — Ambulatory Visit: Payer: Self-pay | Admitting: Physician Assistant

## 2019-05-07 DIAGNOSIS — Z23 Encounter for immunization: Secondary | ICD-10-CM | POA: Diagnosis not present

## 2019-05-10 DIAGNOSIS — I5042 Chronic combined systolic (congestive) and diastolic (congestive) heart failure: Secondary | ICD-10-CM | POA: Diagnosis not present

## 2019-05-10 DIAGNOSIS — I2510 Atherosclerotic heart disease of native coronary artery without angina pectoris: Secondary | ICD-10-CM | POA: Diagnosis not present

## 2019-05-10 DIAGNOSIS — K5900 Constipation, unspecified: Secondary | ICD-10-CM | POA: Diagnosis not present

## 2019-05-10 DIAGNOSIS — I10 Essential (primary) hypertension: Secondary | ICD-10-CM | POA: Diagnosis not present

## 2019-05-10 DIAGNOSIS — K219 Gastro-esophageal reflux disease without esophagitis: Secondary | ICD-10-CM | POA: Diagnosis not present

## 2019-06-05 DIAGNOSIS — I5042 Chronic combined systolic (congestive) and diastolic (congestive) heart failure: Secondary | ICD-10-CM | POA: Diagnosis not present

## 2019-06-05 DIAGNOSIS — I10 Essential (primary) hypertension: Secondary | ICD-10-CM | POA: Diagnosis not present

## 2019-06-05 DIAGNOSIS — I2510 Atherosclerotic heart disease of native coronary artery without angina pectoris: Secondary | ICD-10-CM | POA: Diagnosis not present

## 2019-06-05 DIAGNOSIS — R600 Localized edema: Secondary | ICD-10-CM | POA: Diagnosis not present

## 2019-06-12 DIAGNOSIS — R42 Dizziness and giddiness: Secondary | ICD-10-CM | POA: Diagnosis not present

## 2019-06-14 DIAGNOSIS — E785 Hyperlipidemia, unspecified: Secondary | ICD-10-CM | POA: Diagnosis not present

## 2019-06-14 DIAGNOSIS — I110 Hypertensive heart disease with heart failure: Secondary | ICD-10-CM | POA: Diagnosis not present

## 2019-06-14 DIAGNOSIS — I2510 Atherosclerotic heart disease of native coronary artery without angina pectoris: Secondary | ICD-10-CM | POA: Diagnosis not present

## 2019-06-14 DIAGNOSIS — I509 Heart failure, unspecified: Secondary | ICD-10-CM | POA: Diagnosis not present

## 2019-06-14 DIAGNOSIS — Z79899 Other long term (current) drug therapy: Secondary | ICD-10-CM | POA: Diagnosis not present

## 2019-06-14 DIAGNOSIS — Z8673 Personal history of transient ischemic attack (TIA), and cerebral infarction without residual deficits: Secondary | ICD-10-CM | POA: Diagnosis not present

## 2019-06-14 DIAGNOSIS — Z87891 Personal history of nicotine dependence: Secondary | ICD-10-CM | POA: Diagnosis not present

## 2019-06-14 DIAGNOSIS — Z7901 Long term (current) use of anticoagulants: Secondary | ICD-10-CM | POA: Diagnosis not present

## 2019-06-14 DIAGNOSIS — R42 Dizziness and giddiness: Secondary | ICD-10-CM | POA: Diagnosis not present

## 2019-06-14 DIAGNOSIS — R519 Headache, unspecified: Secondary | ICD-10-CM | POA: Diagnosis not present

## 2019-07-10 DIAGNOSIS — H903 Sensorineural hearing loss, bilateral: Secondary | ICD-10-CM | POA: Diagnosis not present

## 2019-07-10 DIAGNOSIS — H9190 Unspecified hearing loss, unspecified ear: Secondary | ICD-10-CM | POA: Diagnosis not present

## 2019-07-10 DIAGNOSIS — R42 Dizziness and giddiness: Secondary | ICD-10-CM | POA: Diagnosis not present

## 2019-09-05 DIAGNOSIS — I5042 Chronic combined systolic (congestive) and diastolic (congestive) heart failure: Secondary | ICD-10-CM | POA: Diagnosis not present

## 2019-09-05 DIAGNOSIS — I10 Essential (primary) hypertension: Secondary | ICD-10-CM | POA: Diagnosis not present

## 2019-09-05 DIAGNOSIS — I2510 Atherosclerotic heart disease of native coronary artery without angina pectoris: Secondary | ICD-10-CM | POA: Diagnosis not present

## 2019-10-03 DIAGNOSIS — I5042 Chronic combined systolic (congestive) and diastolic (congestive) heart failure: Secondary | ICD-10-CM | POA: Diagnosis not present

## 2019-10-03 DIAGNOSIS — I10 Essential (primary) hypertension: Secondary | ICD-10-CM | POA: Diagnosis not present

## 2019-10-03 DIAGNOSIS — I2510 Atherosclerotic heart disease of native coronary artery without angina pectoris: Secondary | ICD-10-CM | POA: Diagnosis not present

## 2020-01-24 DIAGNOSIS — I5042 Chronic combined systolic (congestive) and diastolic (congestive) heart failure: Secondary | ICD-10-CM | POA: Diagnosis not present

## 2020-01-24 DIAGNOSIS — I252 Old myocardial infarction: Secondary | ICD-10-CM | POA: Diagnosis not present

## 2020-01-24 DIAGNOSIS — Z23 Encounter for immunization: Secondary | ICD-10-CM | POA: Diagnosis not present

## 2020-01-24 DIAGNOSIS — I10 Essential (primary) hypertension: Secondary | ICD-10-CM | POA: Diagnosis not present

## 2020-01-24 DIAGNOSIS — R350 Frequency of micturition: Secondary | ICD-10-CM | POA: Diagnosis not present

## 2020-01-24 DIAGNOSIS — E782 Mixed hyperlipidemia: Secondary | ICD-10-CM | POA: Diagnosis not present

## 2020-01-24 DIAGNOSIS — I480 Paroxysmal atrial fibrillation: Secondary | ICD-10-CM | POA: Diagnosis not present

## 2020-01-24 DIAGNOSIS — Z0000 Encounter for general adult medical examination without abnormal findings: Secondary | ICD-10-CM | POA: Diagnosis not present

## 2020-01-24 DIAGNOSIS — Z8673 Personal history of transient ischemic attack (TIA), and cerebral infarction without residual deficits: Secondary | ICD-10-CM | POA: Diagnosis not present

## 2020-03-04 DIAGNOSIS — K219 Gastro-esophageal reflux disease without esophagitis: Secondary | ICD-10-CM | POA: Diagnosis not present

## 2020-03-04 DIAGNOSIS — M109 Gout, unspecified: Secondary | ICD-10-CM | POA: Diagnosis not present

## 2020-03-04 DIAGNOSIS — I5042 Chronic combined systolic (congestive) and diastolic (congestive) heart failure: Secondary | ICD-10-CM | POA: Diagnosis not present

## 2020-03-04 DIAGNOSIS — I2510 Atherosclerotic heart disease of native coronary artery without angina pectoris: Secondary | ICD-10-CM | POA: Diagnosis not present

## 2020-03-04 DIAGNOSIS — Z8673 Personal history of transient ischemic attack (TIA), and cerebral infarction without residual deficits: Secondary | ICD-10-CM | POA: Diagnosis not present

## 2020-03-04 DIAGNOSIS — E7800 Pure hypercholesterolemia, unspecified: Secondary | ICD-10-CM | POA: Diagnosis not present

## 2020-03-04 DIAGNOSIS — I10 Essential (primary) hypertension: Secondary | ICD-10-CM | POA: Diagnosis not present

## 2020-03-04 DIAGNOSIS — I480 Paroxysmal atrial fibrillation: Secondary | ICD-10-CM | POA: Diagnosis not present

## 2020-03-06 DIAGNOSIS — R350 Frequency of micturition: Secondary | ICD-10-CM | POA: Diagnosis not present

## 2020-03-06 DIAGNOSIS — I509 Heart failure, unspecified: Secondary | ICD-10-CM | POA: Diagnosis not present

## 2020-03-06 DIAGNOSIS — I5042 Chronic combined systolic (congestive) and diastolic (congestive) heart failure: Secondary | ICD-10-CM | POA: Diagnosis not present

## 2020-03-06 DIAGNOSIS — R42 Dizziness and giddiness: Secondary | ICD-10-CM | POA: Diagnosis not present

## 2020-03-06 DIAGNOSIS — R600 Localized edema: Secondary | ICD-10-CM | POA: Diagnosis not present

## 2020-03-06 DIAGNOSIS — I10 Essential (primary) hypertension: Secondary | ICD-10-CM | POA: Diagnosis not present

## 2020-03-20 DIAGNOSIS — I509 Heart failure, unspecified: Secondary | ICD-10-CM | POA: Diagnosis not present

## 2020-04-25 DIAGNOSIS — R7989 Other specified abnormal findings of blood chemistry: Secondary | ICD-10-CM | POA: Diagnosis not present

## 2020-06-21 ENCOUNTER — Other Ambulatory Visit: Payer: Self-pay

## 2020-06-21 ENCOUNTER — Emergency Department
Admission: EM | Admit: 2020-06-21 | Discharge: 2020-06-21 | Disposition: A | Discharge status: Home / Self Care | Payer: Medicare Other | Attending: Emergency Medicine | Admitting: Emergency Medicine

## 2020-06-21 ENCOUNTER — Encounter: Payer: Self-pay | Admitting: Emergency Medicine

## 2020-06-21 ENCOUNTER — Emergency Department: Payer: Medicare Other

## 2020-06-21 DIAGNOSIS — I2510 Atherosclerotic heart disease of native coronary artery without angina pectoris: Secondary | ICD-10-CM | POA: Diagnosis not present

## 2020-06-21 DIAGNOSIS — M47816 Spondylosis without myelopathy or radiculopathy, lumbar region: Secondary | ICD-10-CM | POA: Diagnosis not present

## 2020-06-21 DIAGNOSIS — I110 Hypertensive heart disease with heart failure: Secondary | ICD-10-CM | POA: Insufficient documentation

## 2020-06-21 DIAGNOSIS — Z87891 Personal history of nicotine dependence: Secondary | ICD-10-CM | POA: Diagnosis not present

## 2020-06-21 DIAGNOSIS — R1031 Right lower quadrant pain: Secondary | ICD-10-CM | POA: Diagnosis present

## 2020-06-21 DIAGNOSIS — N132 Hydronephrosis with renal and ureteral calculous obstruction: Secondary | ICD-10-CM | POA: Diagnosis not present

## 2020-06-21 DIAGNOSIS — I509 Heart failure, unspecified: Secondary | ICD-10-CM | POA: Diagnosis not present

## 2020-06-21 DIAGNOSIS — N201 Calculus of ureter: Secondary | ICD-10-CM | POA: Diagnosis not present

## 2020-06-21 DIAGNOSIS — K449 Diaphragmatic hernia without obstruction or gangrene: Secondary | ICD-10-CM | POA: Diagnosis not present

## 2020-06-21 DIAGNOSIS — I700 Atherosclerosis of aorta: Secondary | ICD-10-CM | POA: Diagnosis not present

## 2020-06-21 HISTORY — DX: Cutaneous abscess of left lower limb: L02.416

## 2020-06-21 HISTORY — DX: Atherosclerotic heart disease of native coronary artery without angina pectoris: I25.10

## 2020-06-21 HISTORY — DX: Essential (primary) hypertension: I10

## 2020-06-21 HISTORY — DX: Cerebral infarction, unspecified: I63.9

## 2020-06-21 HISTORY — DX: Cellulitis of left lower limb: L03.116

## 2020-06-21 HISTORY — DX: Gout, unspecified: M10.9

## 2020-06-21 HISTORY — DX: Hyperlipidemia, unspecified: E78.5

## 2020-06-21 HISTORY — DX: Heart failure, unspecified: I50.9

## 2020-06-21 LAB — CBC
HCT: 43.4 % (ref 39.0–52.0)
Hemoglobin: 14.6 g/dL (ref 13.0–17.0)
MCH: 31.2 pg (ref 26.0–34.0)
MCHC: 33.6 g/dL (ref 30.0–36.0)
MCV: 92.7 fL (ref 80.0–100.0)
Platelets: 120 10*3/uL — ABNORMAL LOW (ref 150–400)
RBC: 4.68 MIL/uL (ref 4.22–5.81)
RDW: 13 % (ref 11.5–15.5)
WBC: 6.4 10*3/uL (ref 4.0–10.5)
nRBC: 0 % (ref 0.0–0.2)

## 2020-06-21 LAB — URINALYSIS, COMPLETE (UACMP) WITH MICROSCOPIC
Bacteria, UA: NONE SEEN
Bilirubin Urine: NEGATIVE
Glucose, UA: NEGATIVE mg/dL
Ketones, ur: NEGATIVE mg/dL
Leukocytes,Ua: NEGATIVE
Nitrite: NEGATIVE
Protein, ur: 30 mg/dL — AB
RBC / HPF: >50 RBC/hpf — ABNORMAL HIGH (ref 0–5)
Specific Gravity, Urine: 1.023 (ref 1.005–1.030)
pH: 5 (ref 5.0–8.0)

## 2020-06-21 LAB — COMPREHENSIVE METABOLIC PANEL
ALT: 14 U/L (ref 0–44)
AST: 17 U/L (ref 15–41)
Albumin: 3.8 g/dL (ref 3.5–5.0)
Alkaline Phosphatase: 64 U/L (ref 38–126)
Anion gap: 7 (ref 5–15)
BUN: 21 mg/dL (ref 8–23)
CO2: 22 mmol/L (ref 22–32)
Calcium: 9.2 mg/dL (ref 8.9–10.3)
Chloride: 109 mmol/L (ref 98–111)
Creatinine, Ser: 1.33 mg/dL — ABNORMAL HIGH (ref 0.61–1.24)
GFR, Estimated: 58 mL/min — ABNORMAL LOW (ref >60)
Glucose, Bld: 105 mg/dL — ABNORMAL HIGH (ref 70–99)
Potassium: 4.2 mmol/L (ref 3.5–5.1)
Sodium: 138 mmol/L (ref 135–145)
Total Bilirubin: 0.9 mg/dL (ref 0.3–1.2)
Total Protein: 6.7 g/dL (ref 6.5–8.1)

## 2020-06-21 LAB — LIPASE, BLOOD: Lipase: 47 U/L (ref 11–51)

## 2020-06-21 IMAGING — CT CT ABD-PELV W/O CM
2 of 4 series · 16 of 46 positions shown, 18 images · non-contrast
Comparison: 01/07/2010

CLINICAL DATA: Right lower quadrant pain

EXAM:
CT ABDOMEN AND PELVIS WITHOUT CONTRAST
TECHNIQUE: Multidetector CT imaging of the abdomen and pelvis was performed
following the standard protocol without IV contrast.

[Series 2: routine abd/pel wo · axial · 0.84mm/px · z∈[-493,-33]mm · 13 of 101 slices shown, 15 images]
[im 5/101  soft-tissue]
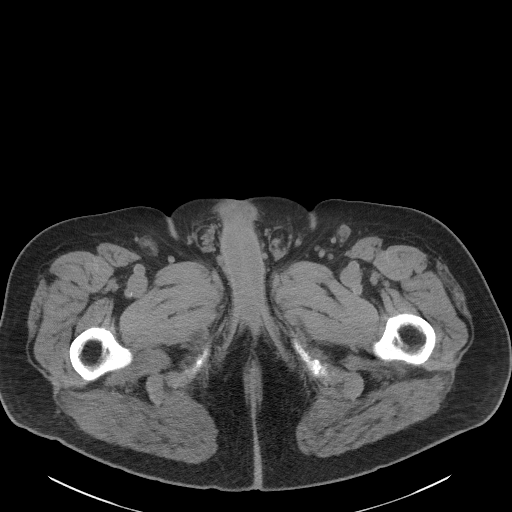
[im 5/101  bone]
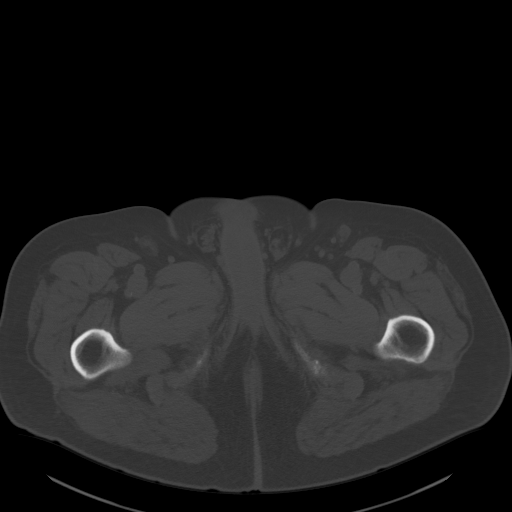
[im 13/101  soft-tissue]
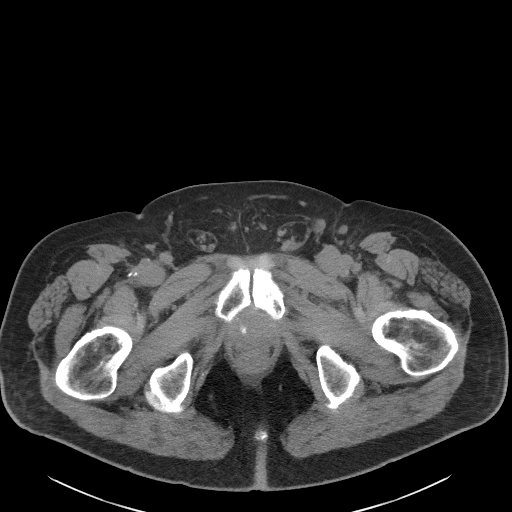
[im 21/101  soft-tissue]
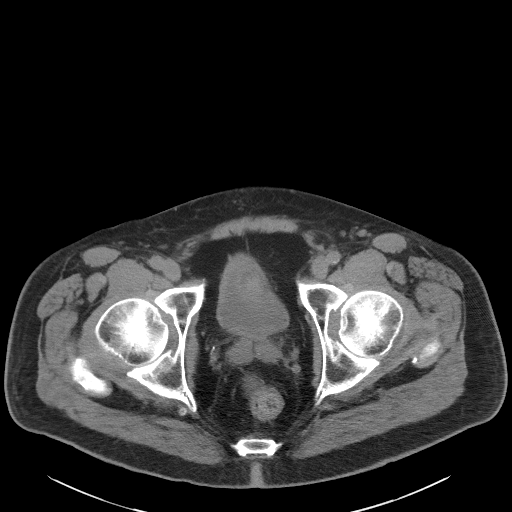
[im 29/101  soft-tissue]
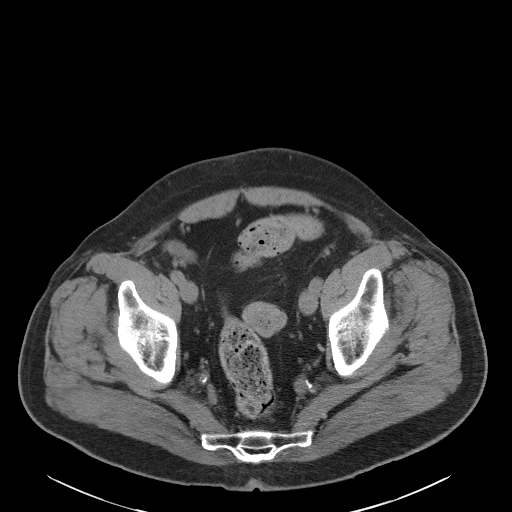
[im 37/101  soft-tissue]
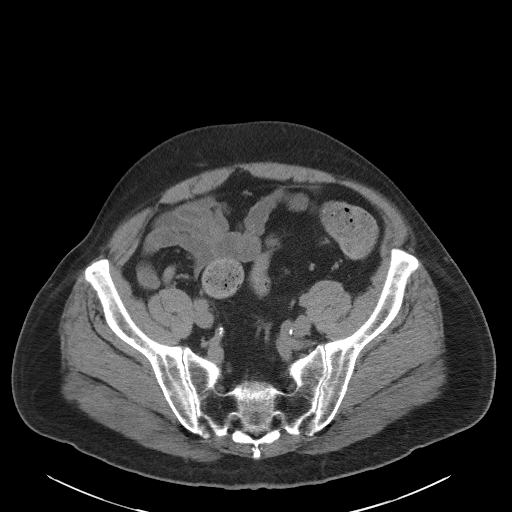
[im 45/101  soft-tissue]
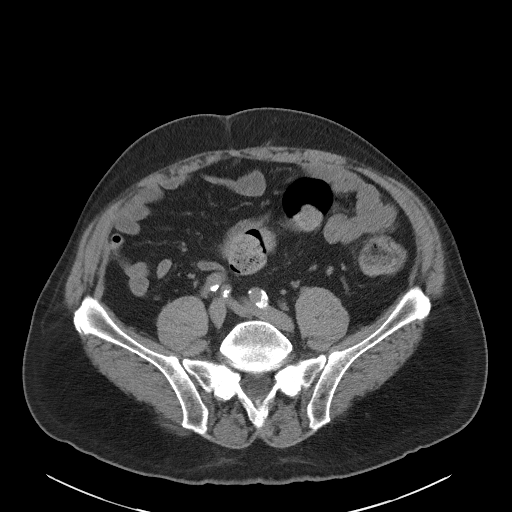
[im 53/101  soft-tissue]
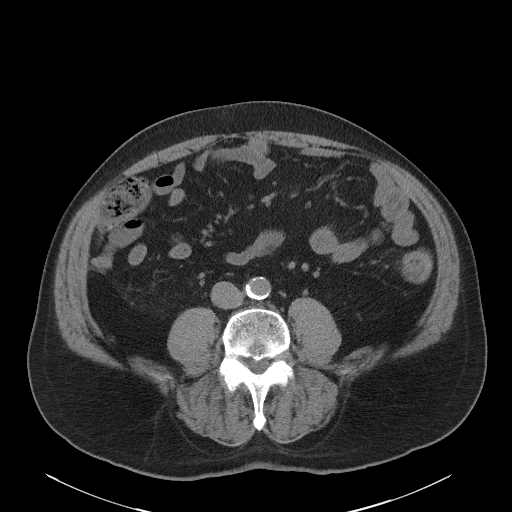
[im 57/101  soft-tissue]
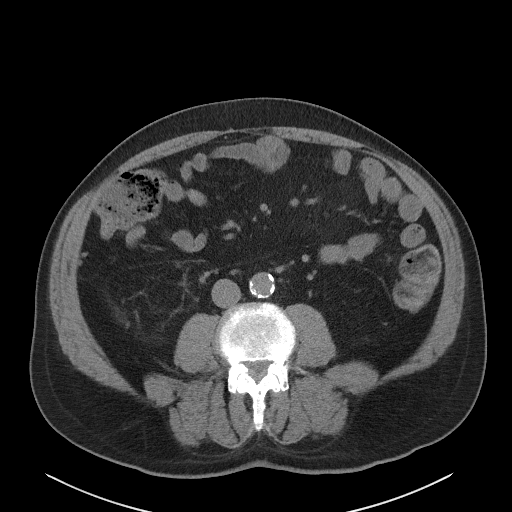
[im 65/101  soft-tissue]
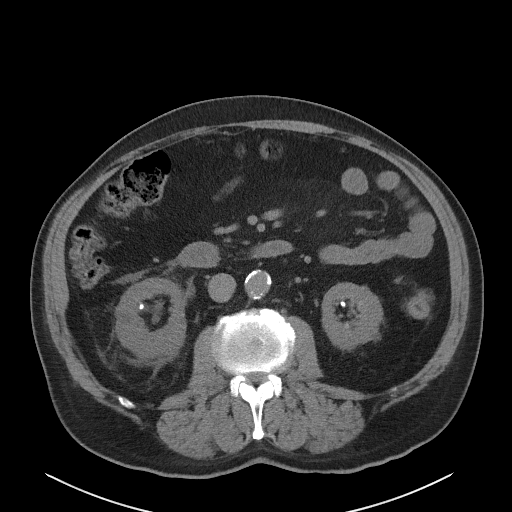
[im 65/101  bone]
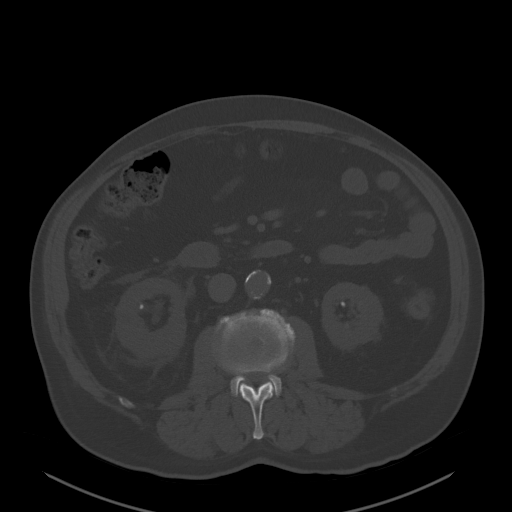
[im 73/101  soft-tissue]
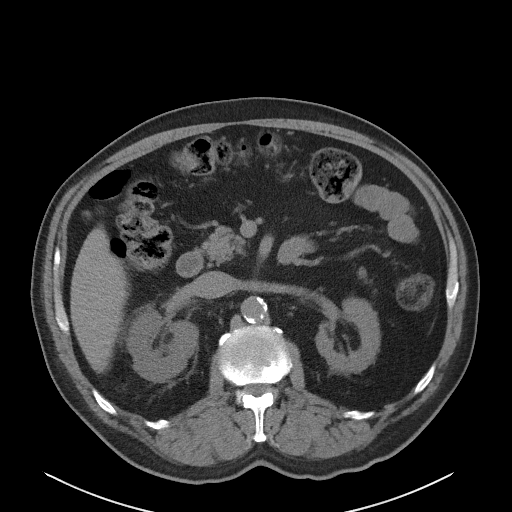
[im 81/101  soft-tissue]
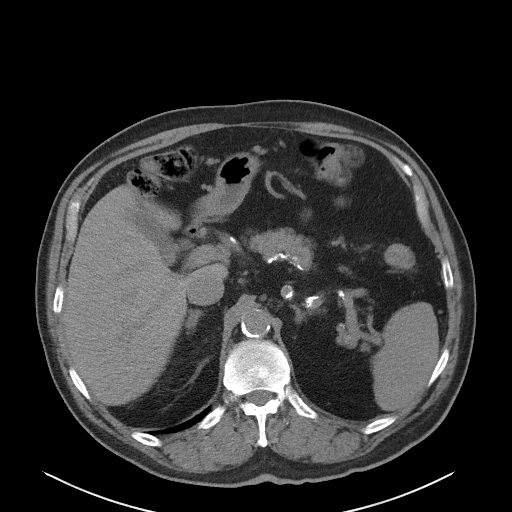
[im 89/101  soft-tissue]
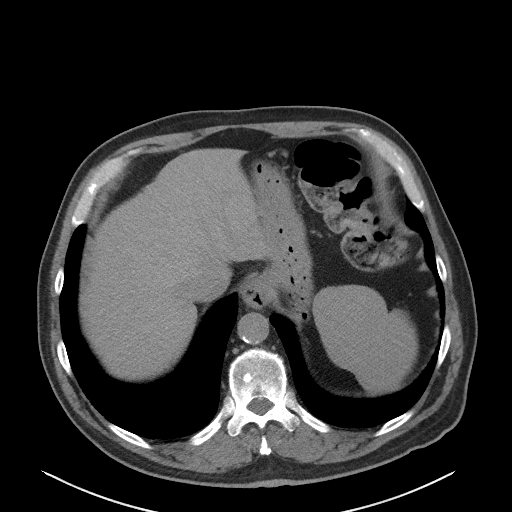
[im 97/101  soft-tissue]
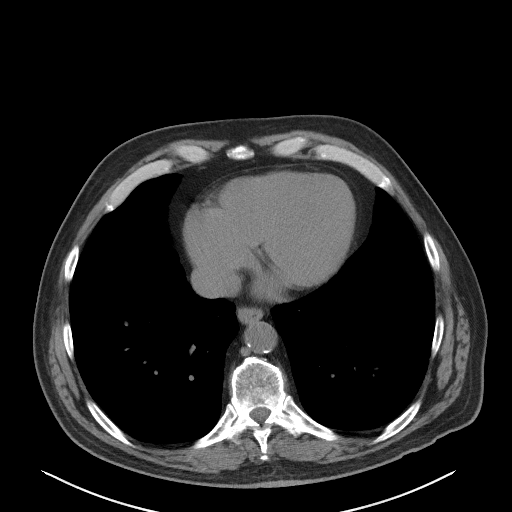

[Series 5: coronal st · coronal · 0.85mm/px · 3 of 112 slices shown]
[im 38/112  soft-tissue]
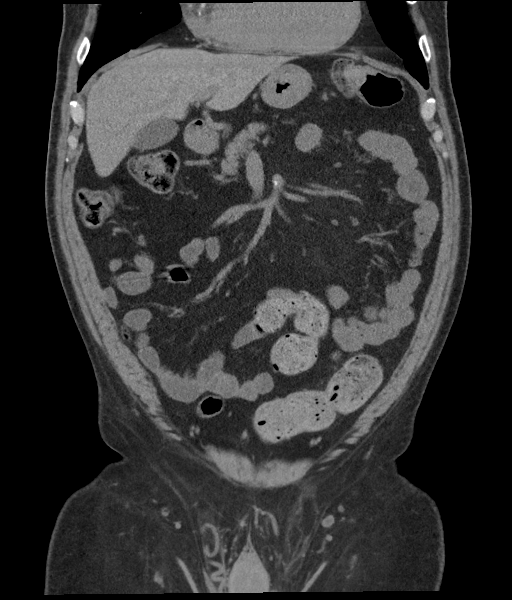
[im 50/112  soft-tissue]
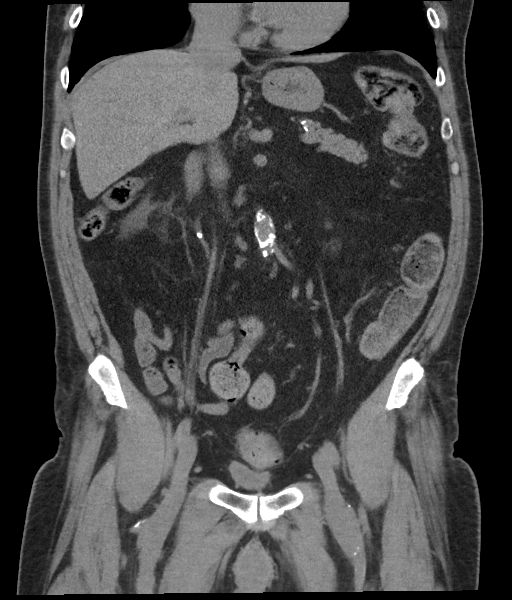
[im 62/112  soft-tissue]
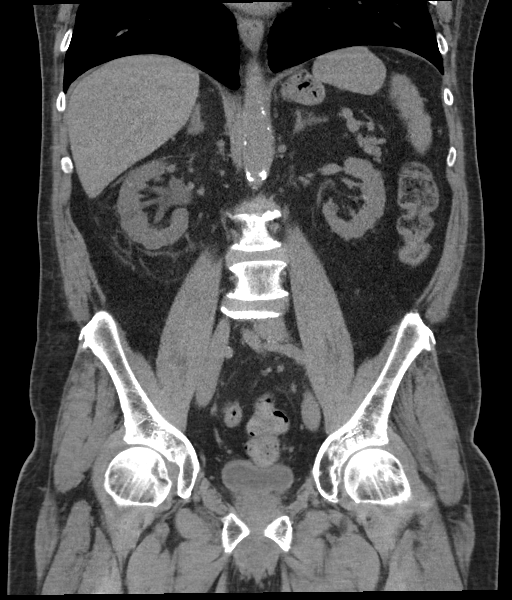

[16 of 46 positions shown; findings below may reference images not displayed]

FINDINGS: Lower chest: No acute abnormality.

Hepatobiliary: No focal liver abnormality is seen. No gallstones,
gallbladder wall thickening, or biliary dilatation.

Pancreas: Unremarkable. No pancreatic ductal dilatation or
surrounding inflammatory changes.

Spleen: Normal in size without focal abnormality.

Adrenals/Urinary Tract: Adrenal glands are within normal limits.
Left kidney demonstrates a few small renal calculi without
obstructive change. The largest of these lies in the lower pole
measuring 3 mm. Right kidney demonstrates multiple small
nonobstructing renal stones. Mild hydronephrosis and hydroureter is
noted secondary to a proximal right ureteral stone measuring 6-7 mm.
The more distal right ureter is within normal limits. Bladder is
decompressed.

Stomach/Bowel: The appendix is within normal limits. No obstructive
or inflammatory changes of the colon are seen. Stomach shows small
sliding-type hiatal hernia. No small bowel abnormality is noted.

Vascular/Lymphatic: Aortic atherosclerosis. No enlarged abdominal or
pelvic lymph nodes.

Reproductive: Prostate is unremarkable.

Other: No abdominal wall hernia or abnormality. No abdominopelvic
ascites.

Musculoskeletal: Degenerative changes of the lumbar spine are noted.
IMPRESSION: 6-7 mm proximal right ureteral stone with hydronephrosis.

Bilateral nonobstructing renal calculi.

No other focal abnormality is noted.

## 2020-06-21 MED ORDER — KETOROLAC TROMETHAMINE 30 MG/ML IJ SOLN
15.0000 mg | Freq: Once | INTRAMUSCULAR | Status: DC
  Filled 2020-06-21: qty 1

## 2020-06-21 MED ORDER — OXYCODONE-ACETAMINOPHEN 5-325 MG PO TABS: 1.0000 | ORAL_TABLET | ORAL | 0 refills | Status: AC | PRN

## 2020-06-21 MED ORDER — KETOROLAC TROMETHAMINE 30 MG/ML IJ SOLN
INTRAMUSCULAR | Status: AC
  Administered 2020-06-21: 60 mg via INTRAMUSCULAR
  Filled 2020-06-21: qty 1

## 2020-06-21 MED ORDER — TAMSULOSIN HCL 0.4 MG PO CAPS: 0.4000 mg | ORAL_CAPSULE | Freq: Every day | ORAL | 0 refills | Status: AC

## 2020-06-21 MED ORDER — KETOROLAC TROMETHAMINE 60 MG/2ML IM SOLN: 60.0000 mg | Freq: Once | INTRAMUSCULAR | Status: AC

## 2020-06-21 NOTE — ED Notes (Signed)
Pt given urinal and instructed on use ?

## 2020-06-21 NOTE — ED Provider Notes (Signed)
[facility] Emergency Department Provider Note  Time seen: 5:40 PM  I have reviewed the triage vital signs and the nursing notes.   HISTORY  Chief Complaint Abdominal Pain   HPI James Gardner is a 70 y.o. male with a past medical history of CHF, gout, hypertension, hyperlipidemia presents to the emergency department for right lower quadrant abdominal pain.  According to the patient he has been experiencing moderate right lower quadrant abdominal pain since this morning.  Denies any fever, dysuria or hematuria.  Denies any nausea vomiting or diarrhea.  Describes pain as moderate dull pain with occasional sharp pains.  Past Medical History:  Diagnosis Date  . Cellulitis and abscess of left lower extremity   . CHF (congestive heart failure) (HCC)   . Coronary artery disease   . Gout   . Hyperlipidemia   . Hypertension   . Stroke [facility])     There are no problems to display for this patient.   Prior to Admission medications   Not on File    Allergies  Allergen Reactions  . Penicillins Rash    No family history on file.  Social History Social History   Tobacco Use  . Smoking status: Former Games developer  . Smokeless tobacco: Never Used  Vaping Use  . Vaping Use: Never used  Substance Use Topics  . Alcohol use: Yes  . Drug use: Never    Review of Systems Constitutional: Negative for fever. Cardiovascular: Negative for chest pain. Respiratory: Negative for shortness of breath. Gastrointestinal: right lower quadrant abdominal pain.  Negative nausea vomiting diarrhea Genitourinary: Negative for urinary compaints Musculoskeletal: Negative for musculoskeletal complaints Neurological: Negative for headache All other ROS negative  ____________________________________________   PHYSICAL EXAM:  VITAL SIGNS: ED Triage Vitals  Enc Vitals Group     BP 06/21/20 1538 (!) 179/102     Pulse Rate 06/21/20 1538 61     Resp 06/21/20 1538 20     Temp  06/21/20 1538 98 F (36.7 C)     Temp Source 06/21/20 1538 Oral     SpO2 06/21/20 1538 99 %     Weight 06/21/20 1534 259 lb (117.5 kg)     Height 06/21/20 1534 6\' 2"  (1.88 m)     Head Circumference --      Peak Flow --      Pain Score 06/21/20 1534 10     Pain Loc --      Pain Edu? --      Excl. in [place]? --     Constitutional: Alert and oriented. Well appearing and in no distress. Eyes: Normal exam ENT      Head: Normocephalic and atraumatic.      Mouth/Throat: Mucous membranes are moist. Cardiovascular: Normal rate, regular rhythm. Respiratory: Normal respiratory effort without tachypnea nor retractions. Breath sounds are clear Gastrointestinal: Soft, moderate tenderness palpation the right lower quadrant without rebound guarding or distention.  Abdomen is otherwise benign. Musculoskeletal: Nontender with normal range of motion in all extremities.  Neurologic:  Normal speech and language. No gross focal neurologic deficits  Skin:  Skin is warm, dry and intact.  Psychiatric: Mood and affect are normal.  ____________________________________________   RADIOLOGY  CT consistent with 6 to 7 mm proximal right ureteral stone.  ____________________________________________   INITIAL IMPRESSION / ASSESSMENT AND PLAN / ED COURSE  Pertinent labs & imaging results that were available during my care of the patient were reviewed by me and considered in my medical  decision making (see chart for details).   Patient presents emergency department for right lower quadrant abdominal pain.  Review of systems is largely negative besides abdominal pain which started this morning.  Lab work is largely nonrevealing.  Reassuringly normal white blood cell count.  Given the patient's tenderness palpation of the right lower quadrant we will proceed with CT imaging to rule out intra-abdominal pathology such as appendicitis, ureterolithiasis, hernia, colitis.  Urinalysis pending.  CT scan shows a 6 to 7 mm  right proximal ureteral scan otherwise negative CT scan.  Patient is now pain-free after Toradol.  We will discharge with Flomax, Percocet and have the patient follow-up with urology given the size of the stone.  We will discharge with urine strainer.  Patient agreeable to plan of care.  Discussed with typical kidney stone return precautions.  James Gardner was evaluated in Emergency Department on 06/21/2020 for the symptoms described in the history of present illness. He was evaluated in the context of the global COVID-19 pandemic, which necessitated consideration that the patient might be at risk for infection with the SARS-CoV-2 virus that causes COVID-19. Institutional protocols and algorithms that pertain to the evaluation of patients at risk for COVID-19 are in a state of rapid change based on information released by regulatory bodies including the CDC and federal and state organizations. These policies and algorithms were followed during the patient's care in the ED.  ____________________________________________   FINAL CLINICAL IMPRESSION(S) / ED DIAGNOSES  Right ureterolithiasis   Minna Antis, MD 06/21/20 2014

## 2020-06-21 NOTE — ED Triage Notes (Signed)
Pt via POV from home. Pt c/o RLQ pain for a couple of hours. Denies NVD. Denies fever. Denies urinary symptoms. Pt is A&Ox4 and NAD.

## 2020-06-21 NOTE — ED Notes (Signed)
Pt c/o right lower quadrant pain x 2 hours, states he almost threw up but did not. Pt reports history of kidney stones but states pain is different this time. Pt denies eating prior to pain, denies strain or injury. See also triage note.

## 2020-06-23 LAB — URINE CULTURE: Culture: NO GROWTH

## 2020-06-24 DIAGNOSIS — H16219 Exposure keratoconjunctivitis, unspecified eye: Secondary | ICD-10-CM | POA: Diagnosis not present

## 2020-06-24 DIAGNOSIS — H02209 Unspecified lagophthalmos unspecified eye, unspecified eyelid: Secondary | ICD-10-CM | POA: Diagnosis not present

## 2020-07-02 DIAGNOSIS — I2510 Atherosclerotic heart disease of native coronary artery without angina pectoris: Secondary | ICD-10-CM | POA: Diagnosis not present

## 2020-07-02 DIAGNOSIS — R55 Syncope and collapse: Secondary | ICD-10-CM | POA: Diagnosis not present

## 2020-07-02 DIAGNOSIS — I10 Essential (primary) hypertension: Secondary | ICD-10-CM | POA: Diagnosis not present

## 2020-07-02 DIAGNOSIS — E7800 Pure hypercholesterolemia, unspecified: Secondary | ICD-10-CM | POA: Diagnosis not present

## 2020-07-02 DIAGNOSIS — I209 Angina pectoris, unspecified: Secondary | ICD-10-CM | POA: Diagnosis not present

## 2020-07-22 DIAGNOSIS — I209 Angina pectoris, unspecified: Secondary | ICD-10-CM | POA: Diagnosis not present

## 2021-03-04 ENCOUNTER — Emergency Department: Payer: Medicare Other

## 2021-03-04 ENCOUNTER — Emergency Department
Admission: EM | Admit: 2021-03-04 | Discharge: 2021-03-04 | Disposition: A | Discharge status: Home / Self Care | Payer: Medicare Other | Attending: Emergency Medicine | Admitting: Emergency Medicine

## 2021-03-04 ENCOUNTER — Encounter: Payer: Self-pay | Admitting: Emergency Medicine

## 2021-03-04 ENCOUNTER — Other Ambulatory Visit: Payer: Self-pay

## 2021-03-04 DIAGNOSIS — I509 Heart failure, unspecified: Secondary | ICD-10-CM | POA: Insufficient documentation

## 2021-03-04 DIAGNOSIS — I4891 Unspecified atrial fibrillation: Secondary | ICD-10-CM | POA: Insufficient documentation

## 2021-03-04 DIAGNOSIS — I951 Orthostatic hypotension: Secondary | ICD-10-CM | POA: Insufficient documentation

## 2021-03-04 DIAGNOSIS — I2510 Atherosclerotic heart disease of native coronary artery without angina pectoris: Secondary | ICD-10-CM | POA: Insufficient documentation

## 2021-03-04 DIAGNOSIS — I110 Hypertensive heart disease with heart failure: Secondary | ICD-10-CM | POA: Insufficient documentation

## 2021-03-04 DIAGNOSIS — R42 Dizziness and giddiness: Secondary | ICD-10-CM | POA: Diagnosis present

## 2021-03-04 DIAGNOSIS — Z7901 Long term (current) use of anticoagulants: Secondary | ICD-10-CM | POA: Diagnosis not present

## 2021-03-04 LAB — BASIC METABOLIC PANEL
Anion gap: 5 (ref 5–15)
BUN: 13 mg/dL (ref 8–23)
CO2: 26 mmol/L (ref 22–32)
Calcium: 9 mg/dL (ref 8.9–10.3)
Chloride: 104 mmol/L (ref 98–111)
Creatinine, Ser: 1.14 mg/dL (ref 0.61–1.24)
GFR, Estimated: >60 mL/min (ref >60)
Glucose, Bld: 94 mg/dL (ref 70–99)
Potassium: 4.3 mmol/L (ref 3.5–5.1)
Sodium: 135 mmol/L (ref 135–145)

## 2021-03-04 LAB — CBC
HCT: 43.2 % (ref 39.0–52.0)
Hemoglobin: 13.9 g/dL (ref 13.0–17.0)
MCH: 31.9 pg (ref 26.0–34.0)
MCHC: 32.2 g/dL (ref 30.0–36.0)
MCV: 99.1 fL (ref 80.0–100.0)
Platelets: 119 10*3/uL — ABNORMAL LOW (ref 150–400)
RBC: 4.36 MIL/uL (ref 4.22–5.81)
RDW: 14.6 % (ref 11.5–15.5)
WBC: 5.4 10*3/uL (ref 4.0–10.5)
nRBC: 0 % (ref 0.0–0.2)

## 2021-03-04 LAB — TROPONIN I (HIGH SENSITIVITY)
Troponin I (High Sensitivity): 5 ng/L (ref <18)
Troponin I (High Sensitivity): 5 ng/L (ref <18)

## 2021-03-04 IMAGING — CR DG CHEST 2V
2 series · 2 of 2 positions shown · non-contrast
Comparison: May 19, 2008.

CLINICAL DATA: Syncope.

EXAM:
CHEST - 2 VIEW

[chest pa]
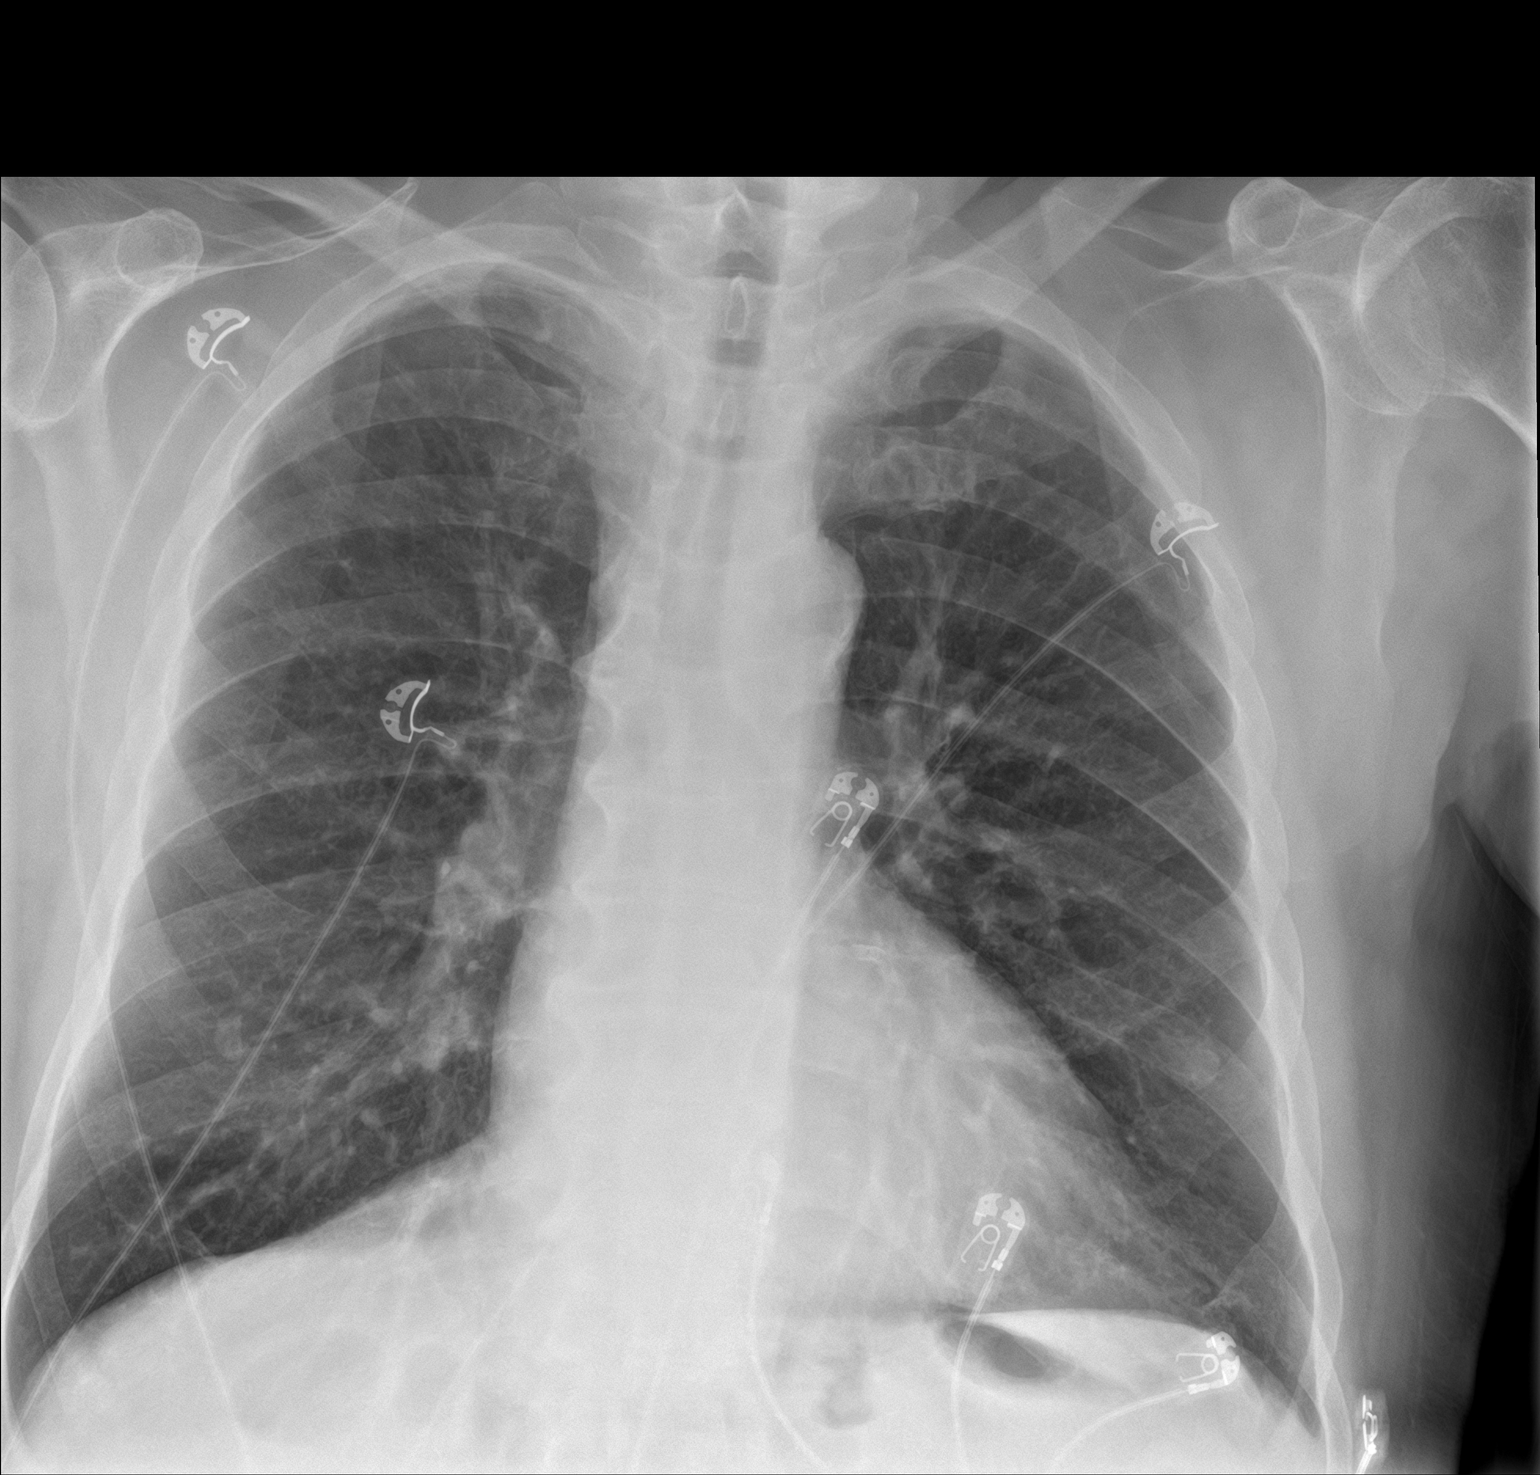

[chest lat]
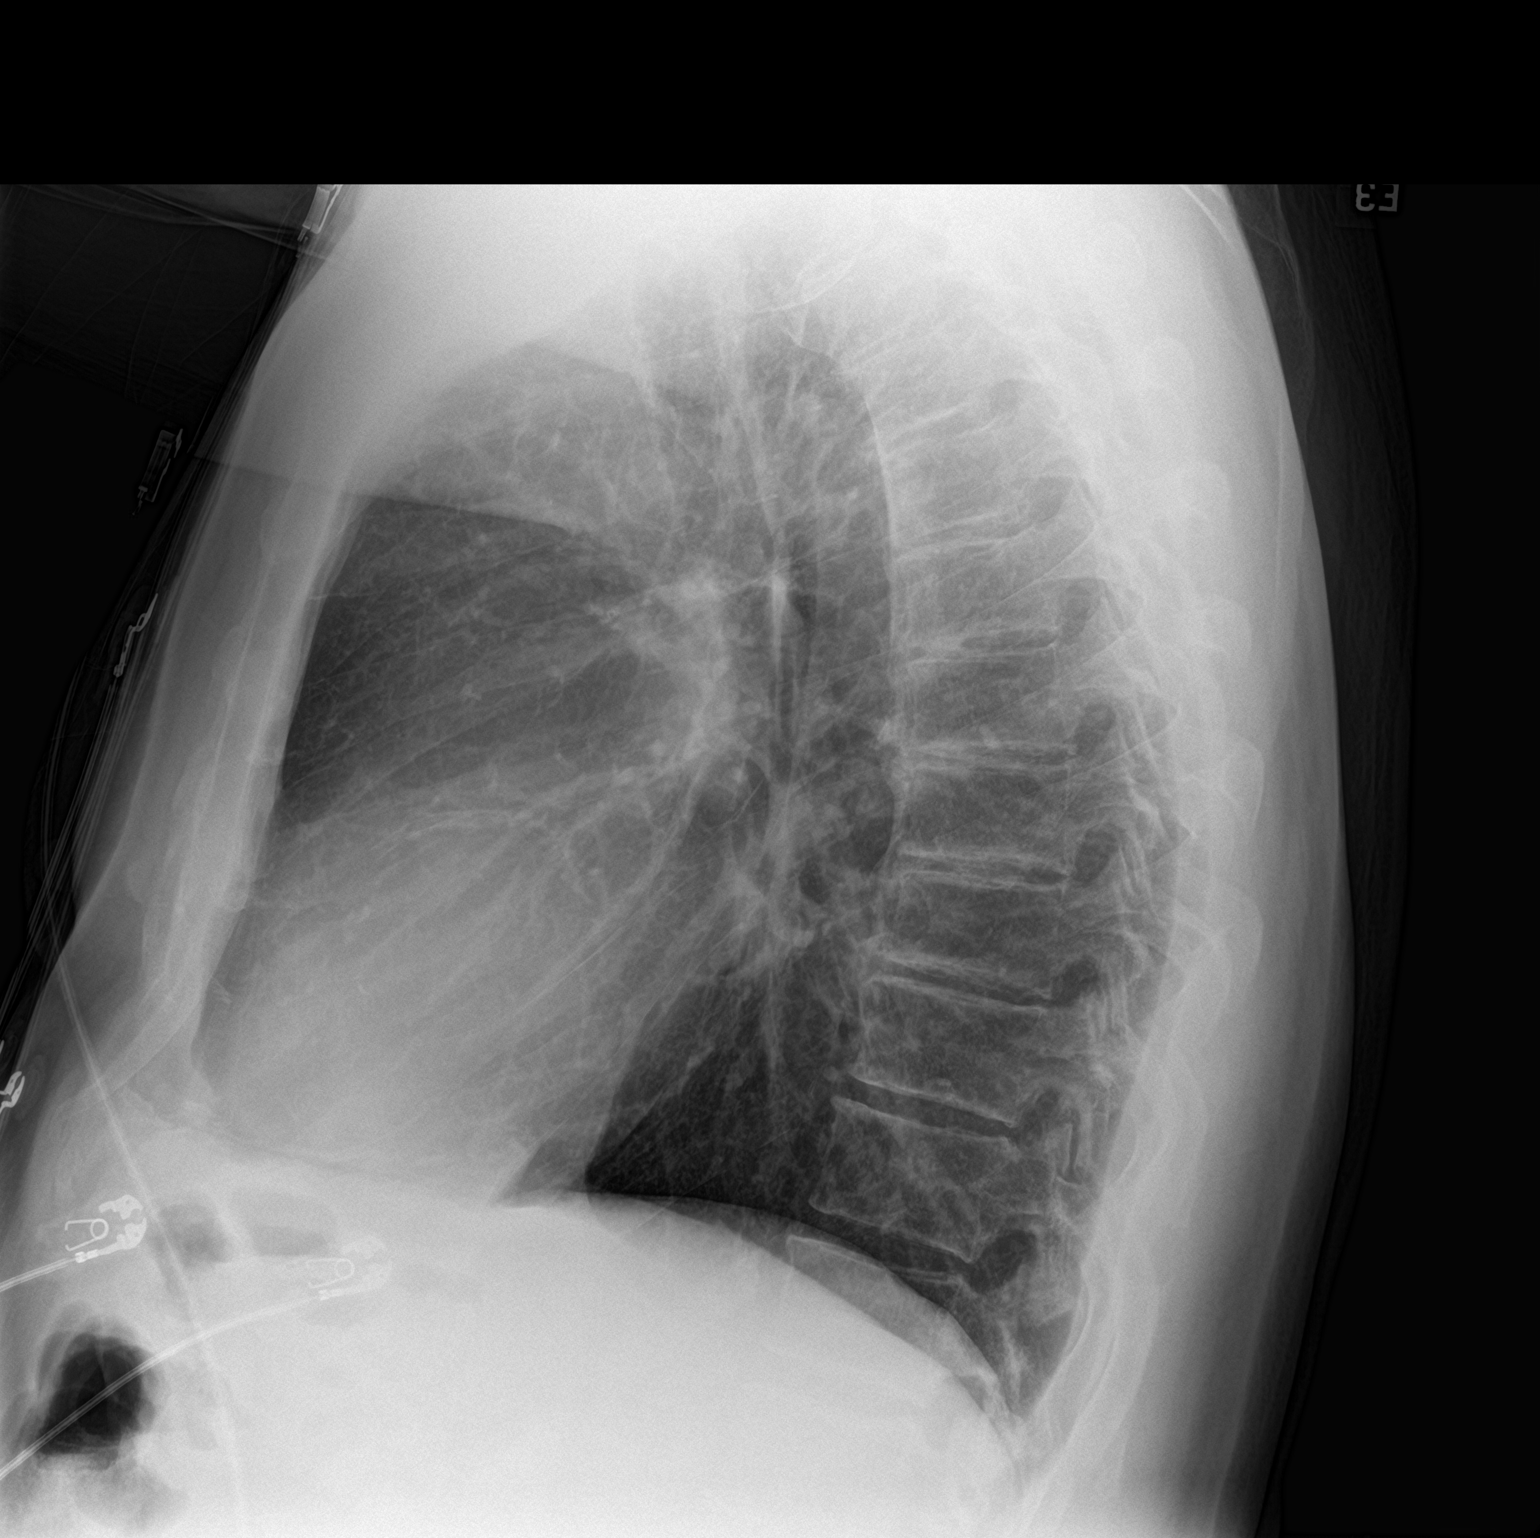

[2 of 2 positions shown; findings below may reference images not displayed]

FINDINGS: The heart size and mediastinal contours are within normal limits.
Two nodular opacities are seen overlying the lung bases which may
represent overlying nipple shadow. No definite consolidative process
is noted. The visualized skeletal structures are unremarkable.
IMPRESSION: Two nodular opacities seen over the lung bases which may represent
overlying nipple shadows. Repeat radiograph with nipple markers is
recommended to rule out pulmonary nodules. No other abnormality is
noted.

## 2021-03-04 IMAGING — CR DG CHEST 2V
1 series · 2 of 2 positions shown · non-contrast
Comparison: Earlier same day

CLINICAL DATA: Abnormal radiograph, repeat with nipple markers

EXAM:
CHEST - 2 VIEW

[Series 1: dg chest 2 view · 0.14mm/px · 2 of 2 slices shown]
[im 1/2]
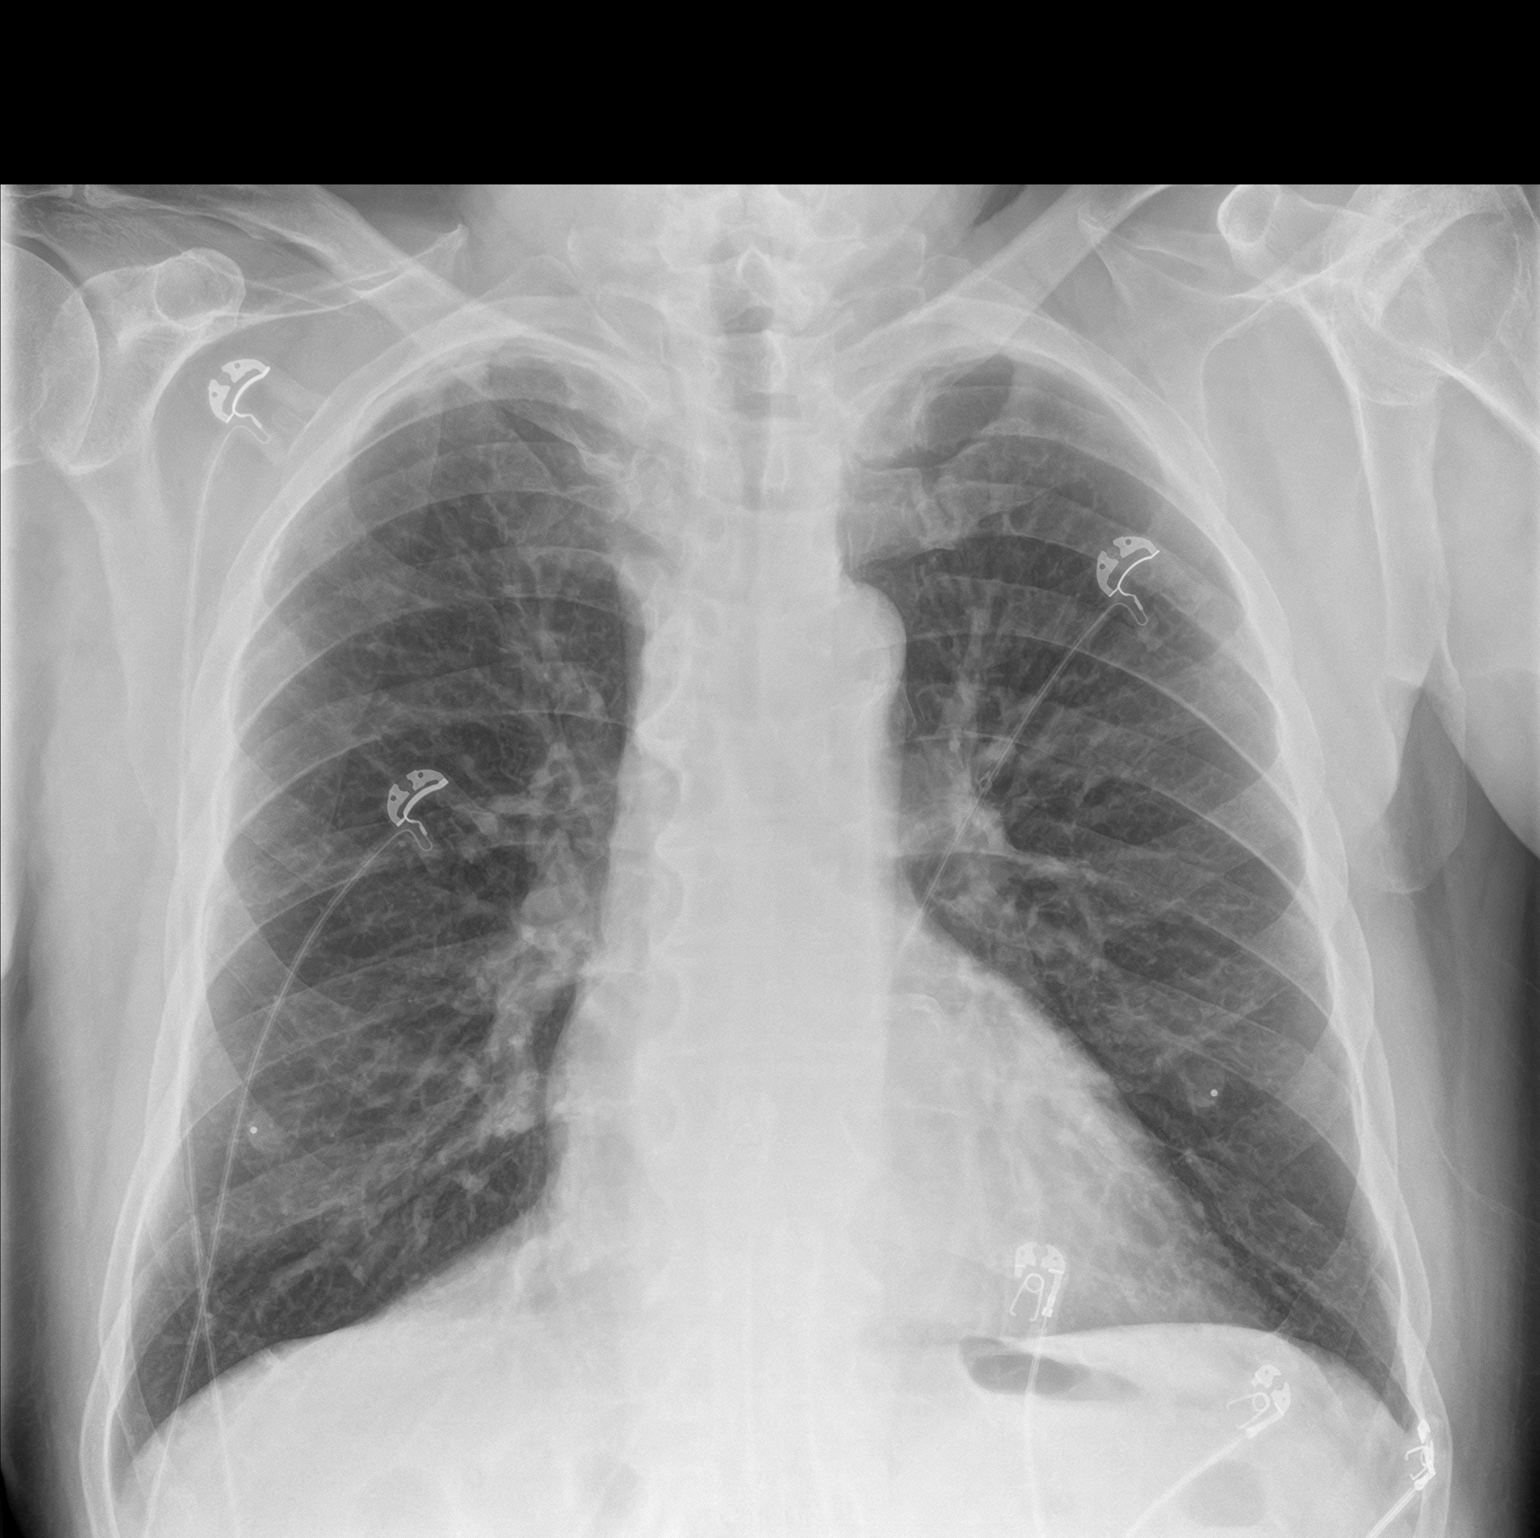
[im 2/2]
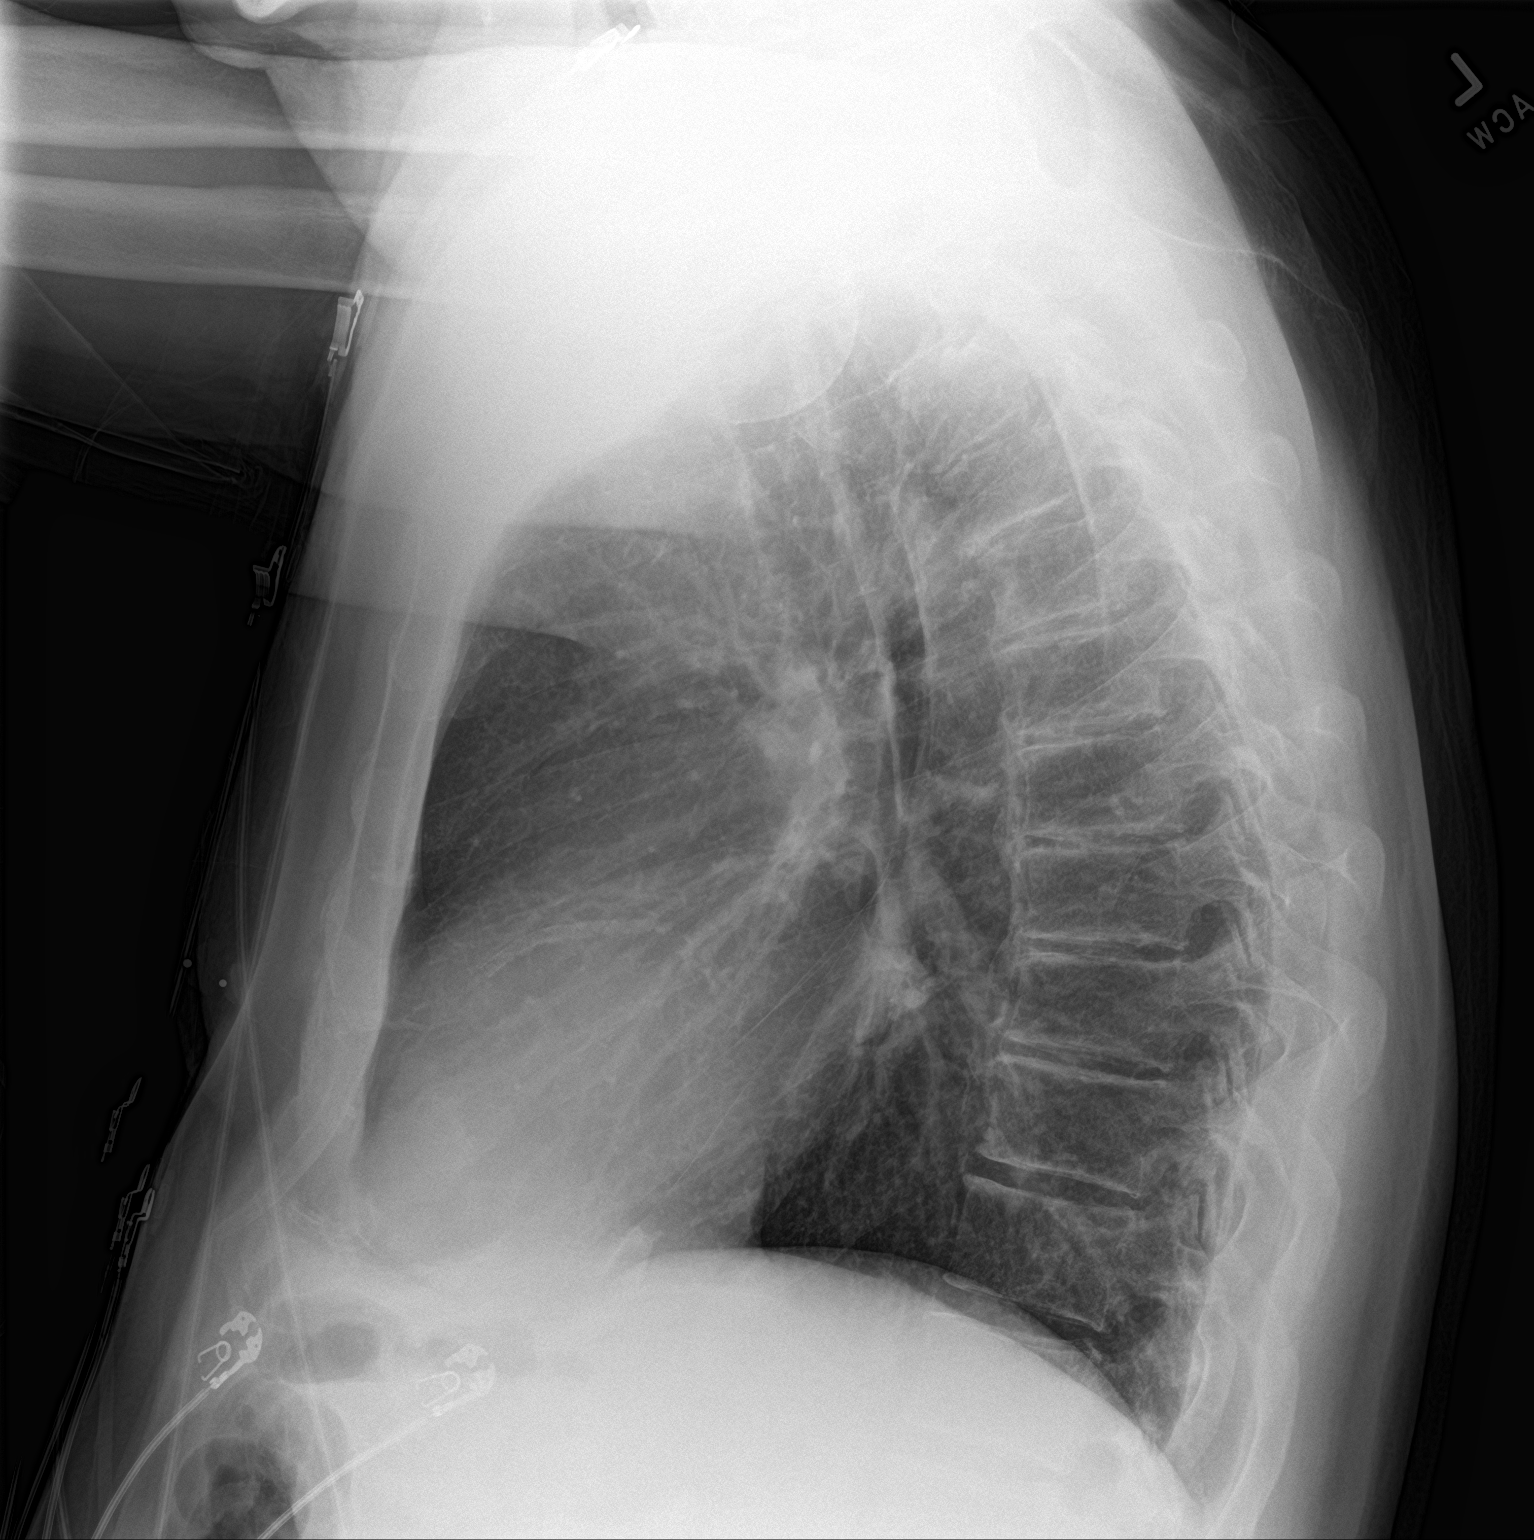

[2 of 2 positions shown; findings below may reference images not displayed]

FINDINGS: Nodular opacities on the prior study correspond to nipple markers.
No new finding.
IMPRESSION: Nodular opacities on prior study reflect nipple shadows.

## 2021-03-04 MED ORDER — SODIUM CHLORIDE 0.9 % IV BOLUS
500.0000 mL | Freq: Once | INTRAVENOUS | Status: AC
  Administered 2021-03-04: 500 mL via INTRAVENOUS

## 2021-03-04 NOTE — ED Notes (Signed)
Pt to XRAY

## 2021-03-04 NOTE — ED Notes (Signed)
See triage note. Pt reports intermittent dizziness since Monday. Reports blood pressure going up and down as well. States sometimes checks it at night.

## 2021-03-04 NOTE — ED Triage Notes (Signed)
C/O near syncope on Monday. Today, c/o dizziness and laibile BP.  AAOx3.  Skin warm and dry. NAD

## 2021-03-04 NOTE — ED Provider Notes (Signed)
[facility] Provider Note    Event Date/Time   First MD Initiated Contact with Patient 03/04/21 [phone]     (approximate)   History   Dizziness   HPI  James Gardner is a 71 y.o. male who presents to the ED for evaluation of Dizziness   Review outpatient cardiology visit from 11/23.  History of CAD, HTN, gout and anticoagulated on Eliquis due to atrial fibrillation.  CHF.  Patient presents to the ED for evaluation of a few episodes of positional lightheaded dizziness over the past 3 days.  Patient reports he still works, and Monday morning after sitting for couple hours, he stood up and felt presyncopal dizziness that resolved after a few moments.  He reports this happened about 3 times over the course of the day, and another 1 or 2 times yesterday.  He reports the symptoms tend to happen when he is standing up from a seated position.  He denies any coexisting symptoms alongside the dizziness, such as chest pain, headache, dyspnea or palpitations.  He denies any decreased exercise tolerance, dyspnea on exertion or chest pain.  Denies increased lower extremity swelling, fever, nausea or emesis.  Reports he feels fine right now.  Physical Exam   Triage Vital Signs: ED Triage Vitals  Enc Vitals Group     BP 03/04/21 0843 (!) 158/85     Pulse Rate 03/04/21 0843 64     Resp 03/04/21 0843 16     Temp 03/04/21 0843 98.1 F (36.7 C)     Temp Source 03/04/21 0843 Oral     SpO2 03/04/21 0843 100 %     Weight 03/04/21 0838 259 lb 0.7 oz (117.5 kg)     Height 03/04/21 0838 6\' 2"  (1.88 m)     Head Circumference --      Peak Flow --      Pain Score 03/04/21 0837 0     Pain Loc --      Pain Edu? --      Excl. in [place]? --     Most recent vital signs: Vitals:   03/04/21 1030 03/04/21 1100  BP: (!) 163/94 (!) 164/93  Pulse: (!) 59 (!) 59  Resp: 13 13  Temp:    SpO2: 100% 100%    General: Awake, no distress.  Pleasant and conversational in full  sentences. CV:  Good peripheral perfusion.  Resp:  Normal effort.  CTAB Abd:  No distention.  Soft and benign MSK:  No deformity noted.  Trace pitting edema to bilateral lower extremities, chronic per the patient Neuro:  No focal deficits appreciated. Cranial nerves II through XII intact 5/5 strength and sensation in all 4 extremities] Other:     ED Results / Procedures / Treatments   Labs (all labs ordered are listed, but only abnormal results are displayed) Labs Reviewed  CBC - Abnormal; Notable for the following components:      Result Value   Platelets 119 (*)    All other components within normal limits  BASIC METABOLIC PANEL  TROPONIN I (HIGH SENSITIVITY)  TROPONIN I (HIGH SENSITIVITY)    EKG Sinus rhythm, rate of 67 bpm.  Normal axis and intervals.  No STEMI.  RADIOLOGY CXR reviewed by me without evidence of acute cardiopulmonary pathology.  Official radiology report(s): DG Chest 2 View  Result Date: 03/04/2021 CLINICAL DATA:  Abnormal radiograph, repeat with nipple markers EXAM: CHEST - 2 VIEW COMPARISON:  Earlier same day FINDINGS: Nodular opacities  on the prior study correspond to nipple markers. No new finding. IMPRESSION: Nodular opacities on prior study reflect nipple shadows. Electronically Signed   By: Macy Mis M.D.   On: 03/04/2021 09:42   DG Chest 2 View  Result Date: 03/04/2021 CLINICAL DATA:  Syncope. EXAM: CHEST - 2 VIEW COMPARISON:  May 19, 2008. FINDINGS: The heart size and mediastinal contours are within normal limits. Two nodular opacities are seen overlying the lung bases which may represent overlying nipple shadow. No definite consolidative process is noted. The visualized skeletal structures are unremarkable. IMPRESSION: Two nodular opacities seen over the lung bases which may represent overlying nipple shadows. Repeat radiograph with nipple markers is recommended to rule out pulmonary nodules. No other abnormality is noted. Electronically Signed    By: Marijo Conception M.D.   On: 03/04/2021 08:58    PROCEDURES and INTERVENTIONS:  .1-3 Lead EKG Interpretation Performed by: Vladimir Crofts, MD Authorized by: Vladimir Crofts, MD     Interpretation: normal     ECG rate:  60   ECG rate assessment: normal     Rhythm: sinus rhythm     Ectopy: none     Conduction: normal    Medications  sodium chloride 0.9 % bolus 500 mL (0 mLs Intravenous Stopped 03/04/21 1019)     IMPRESSION / MDM / ASSESSMENT AND PLAN / ED COURSE  I reviewed the triage vital signs and the nursing notes.  71yo man presents to the ED with a few days of positional presyncope, with evidence of mild orthostasis, ultimately suitable for outpatient management.  He looks clinically well and is asymptomatic while seated.  No evidence of neurologic or vascular deficits.  His blood work is benign with normal CBC and BMP.  2 negative troponins.  Chest x-ray is clear and his EKG is nonischemic without concerning interval changes.  After a 500 cc fluid bolus, he is ambulatory at his baseline without lightheaded dizziness.  No barriers to outpatient management and no indications for CNS imaging at this time.  Will discharge with return precautions.  Clinical Course as of 03/04/21 1226  Wed Mar 04, 2021  1003 Reassessed.  Feeling well. [DS]  [id number] without symptoms and requesting dc [DS]    Clinical Course User Index [DS] Vladimir Crofts, MD     FINAL CLINICAL IMPRESSION(S) / ED DIAGNOSES   Final diagnoses:  Lightheadedness  Orthostasis     Rx / DC Orders   ED Discharge Orders     None        Note:  This document was prepared using Dragon voice recognition software and may include unintentional dictation errors.   Vladimir Crofts, MD 03/04/21 [phone]

## 2021-03-08 ENCOUNTER — Emergency Department: Payer: Medicare Other

## 2021-03-08 ENCOUNTER — Emergency Department
Admission: EM | Admit: 2021-03-08 | Discharge: 2021-03-08 | Disposition: A | Discharge status: Home / Self Care | Payer: Medicare Other | Attending: Emergency Medicine | Admitting: Emergency Medicine

## 2021-03-08 ENCOUNTER — Other Ambulatory Visit: Payer: Self-pay

## 2021-03-08 DIAGNOSIS — I2510 Atherosclerotic heart disease of native coronary artery without angina pectoris: Secondary | ICD-10-CM | POA: Insufficient documentation

## 2021-03-08 DIAGNOSIS — Z7901 Long term (current) use of anticoagulants: Secondary | ICD-10-CM | POA: Insufficient documentation

## 2021-03-08 DIAGNOSIS — I110 Hypertensive heart disease with heart failure: Secondary | ICD-10-CM | POA: Diagnosis not present

## 2021-03-08 DIAGNOSIS — R1031 Right lower quadrant pain: Secondary | ICD-10-CM | POA: Diagnosis not present

## 2021-03-08 DIAGNOSIS — I509 Heart failure, unspecified: Secondary | ICD-10-CM | POA: Diagnosis not present

## 2021-03-08 LAB — CBC WITH DIFFERENTIAL/PLATELET
Abs Immature Granulocytes: 0.02 10*3/uL (ref 0.00–0.07)
Basophils Absolute: 0 10*3/uL (ref 0.0–0.1)
Basophils Relative: 1 %
Eosinophils Absolute: 0.1 10*3/uL (ref 0.0–0.5)
Eosinophils Relative: 3 %
HCT: 42.2 % (ref 39.0–52.0)
Hemoglobin: 13.8 g/dL (ref 13.0–17.0)
Immature Granulocytes: 1 %
Lymphocytes Relative: 25 %
Lymphs Abs: 1.1 10*3/uL (ref 0.7–4.0)
MCH: 32.7 pg (ref 26.0–34.0)
MCHC: 32.7 g/dL (ref 30.0–36.0)
MCV: 100 fL (ref 80.0–100.0)
Monocytes Absolute: 0.3 10*3/uL (ref 0.1–1.0)
Monocytes Relative: 8 %
Neutro Abs: 2.6 10*3/uL (ref 1.7–7.7)
Neutrophils Relative %: 62 %
Platelets: 120 10*3/uL — ABNORMAL LOW (ref 150–400)
RBC: 4.22 MIL/uL (ref 4.22–5.81)
RDW: 14.2 % (ref 11.5–15.5)
WBC: 4.2 10*3/uL (ref 4.0–10.5)
nRBC: 0 % (ref 0.0–0.2)

## 2021-03-08 LAB — URINALYSIS, COMPLETE (UACMP) WITH MICROSCOPIC
Bacteria, UA: NONE SEEN
Bilirubin Urine: NEGATIVE
Glucose, UA: NEGATIVE mg/dL
Hgb urine dipstick: NEGATIVE
Ketones, ur: NEGATIVE mg/dL
Leukocytes,Ua: NEGATIVE
Nitrite: NEGATIVE
Protein, ur: NEGATIVE mg/dL
Specific Gravity, Urine: 1.024 (ref 1.005–1.030)
pH: 5 (ref 5.0–8.0)

## 2021-03-08 LAB — COMPREHENSIVE METABOLIC PANEL
ALT: 16 U/L (ref 0–44)
AST: 29 U/L (ref 15–41)
Albumin: 3.4 g/dL — ABNORMAL LOW (ref 3.5–5.0)
Alkaline Phosphatase: 63 U/L (ref 38–126)
Anion gap: 5 (ref 5–15)
BUN: 21 mg/dL (ref 8–23)
CO2: 25 mmol/L (ref 22–32)
Calcium: 8.8 mg/dL — ABNORMAL LOW (ref 8.9–10.3)
Chloride: 103 mmol/L (ref 98–111)
Creatinine, Ser: 1.29 mg/dL — ABNORMAL HIGH (ref 0.61–1.24)
GFR, Estimated: 59 mL/min — ABNORMAL LOW (ref >60)
Glucose, Bld: 96 mg/dL (ref 70–99)
Potassium: 5.5 mmol/L — ABNORMAL HIGH (ref 3.5–5.1)
Sodium: 133 mmol/L — ABNORMAL LOW (ref 135–145)
Total Bilirubin: 1.4 mg/dL — ABNORMAL HIGH (ref 0.3–1.2)
Total Protein: 6.6 g/dL (ref 6.5–8.1)

## 2021-03-08 LAB — LIPASE, BLOOD: Lipase: 57 U/L — ABNORMAL HIGH (ref 11–51)

## 2021-03-08 IMAGING — CT CT ABD-PELV W/ CM
2 of 5 series · 15 of 46 positions shown, 17 images · IV contrast (APPLIED)
Comparison: 06/21/2020

CLINICAL DATA: Right lower quadrant pain.

EXAM:
CT ABDOMEN AND PELVIS WITH CONTRAST
TECHNIQUE: Multidetector CT imaging of the abdomen and pelvis was performed
using the standard protocol following bolus administration of
intravenous contrast.

[Series 2: routine abd/pel with · axial · 0.92mm/px · z∈[-1097,-557]mm · 12 of 121 slices shown, 14 images]
[im 7/121  soft-tissue]
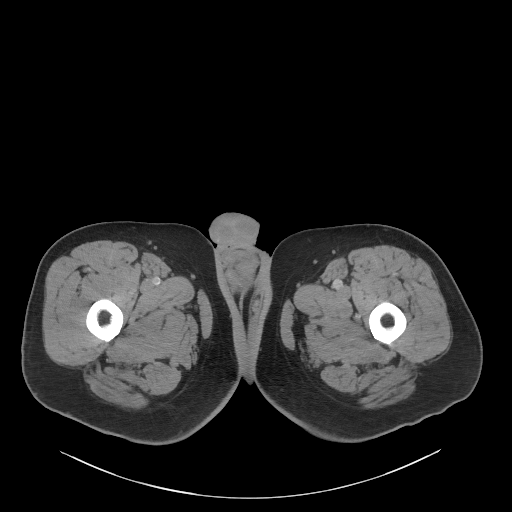
[im 7/121  bone]
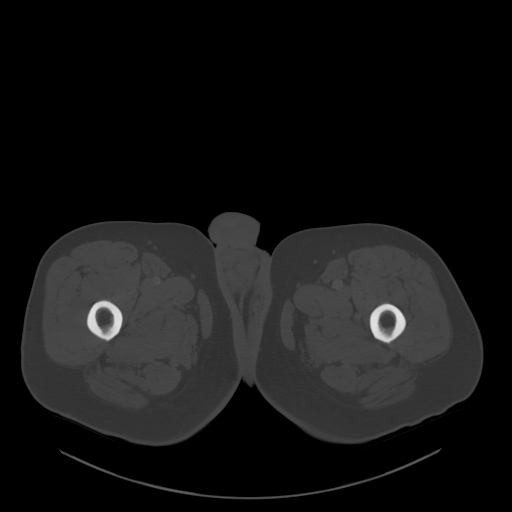
[im 19/121  soft-tissue]
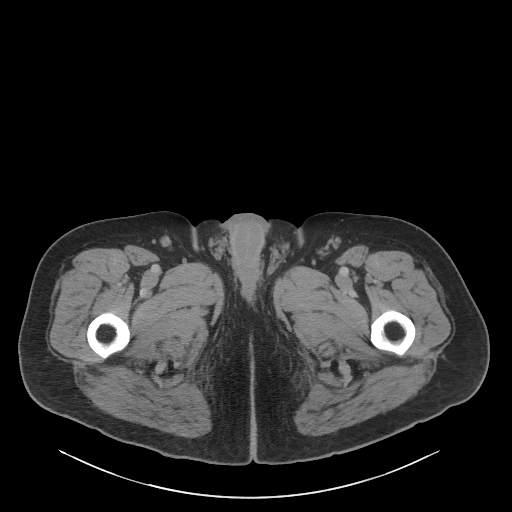
[im 25/121  soft-tissue]
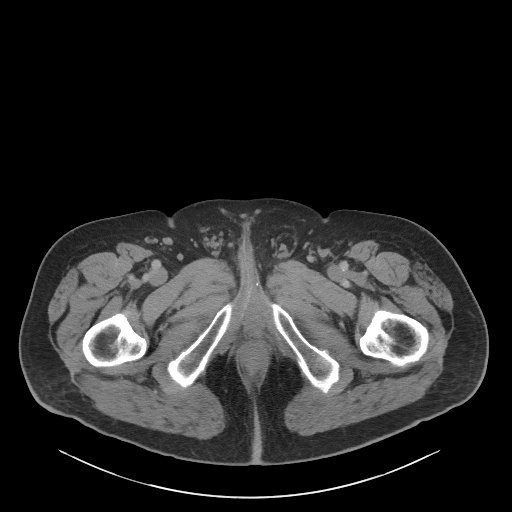
[im 37/121  soft-tissue]
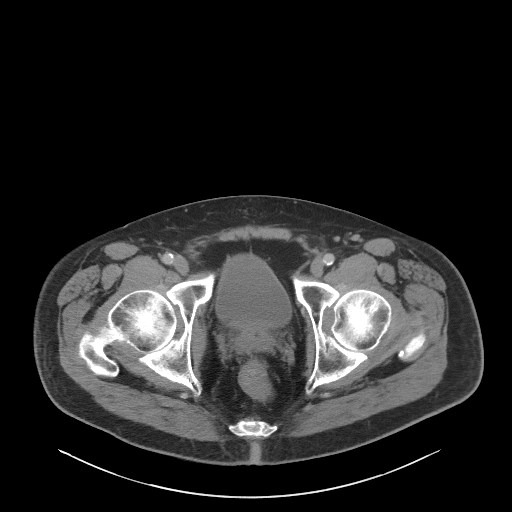
[im 49/121  soft-tissue]
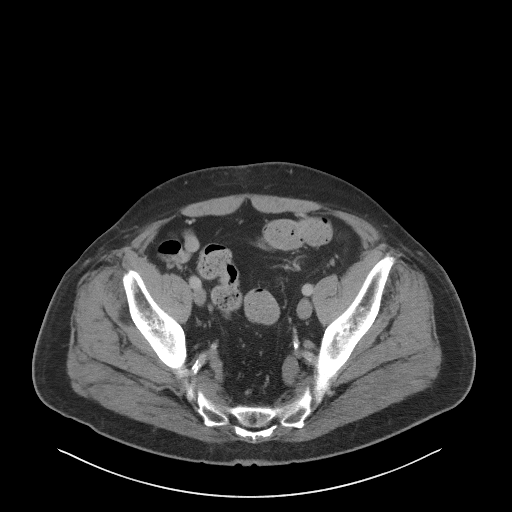
[im 55/121  soft-tissue]
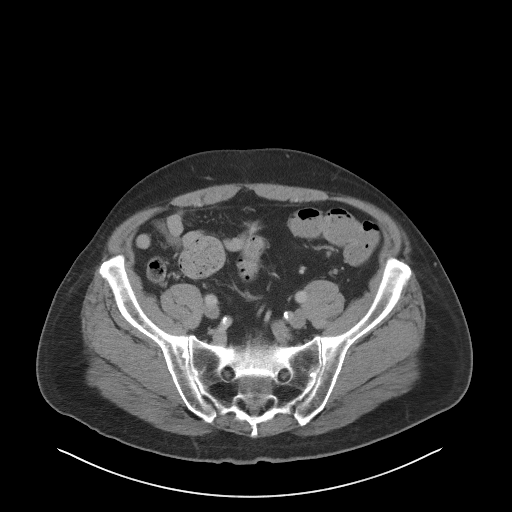
[im 67/121  soft-tissue]
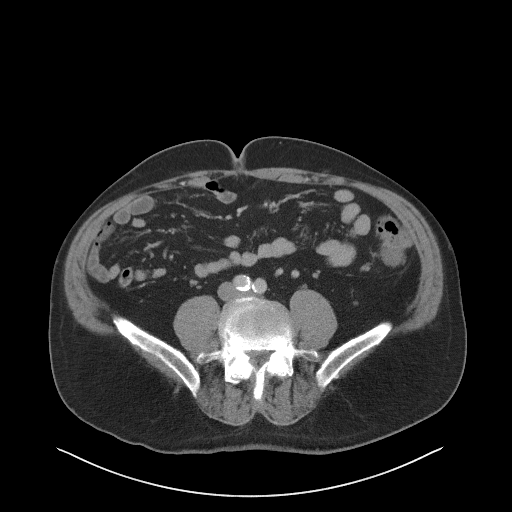
[im 73/121  soft-tissue]
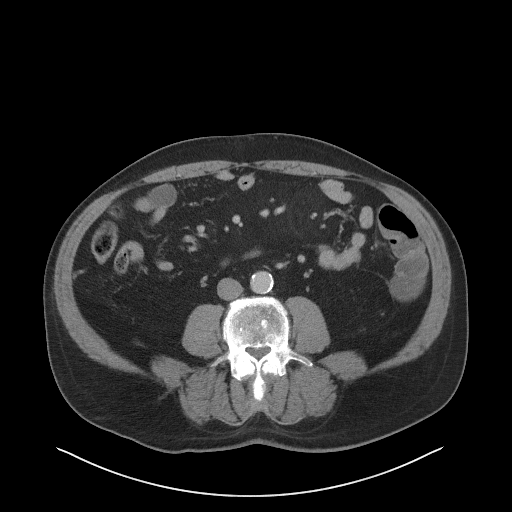
[im 85/121  soft-tissue]
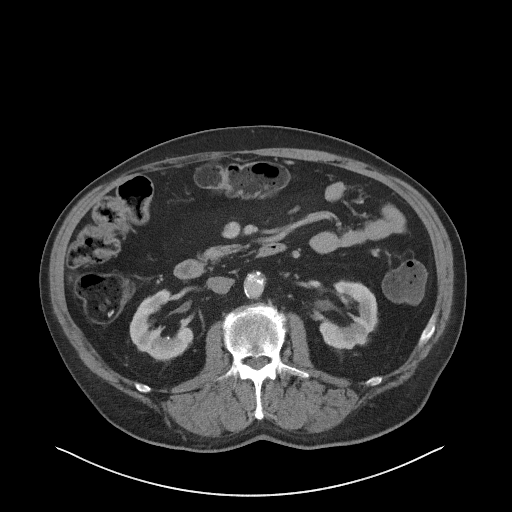
[im 85/121  bone]
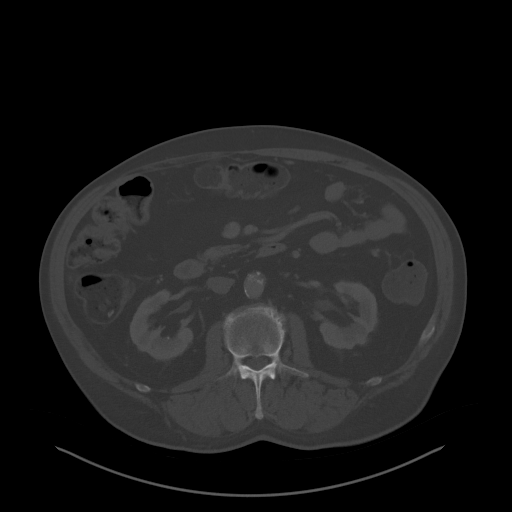
[im 97/121  soft-tissue]
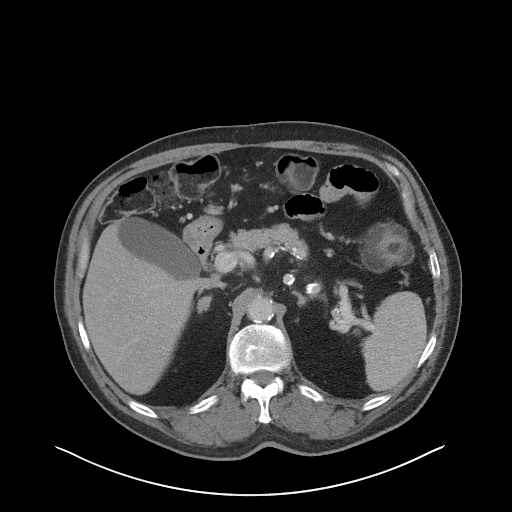
[im 103/121  soft-tissue]
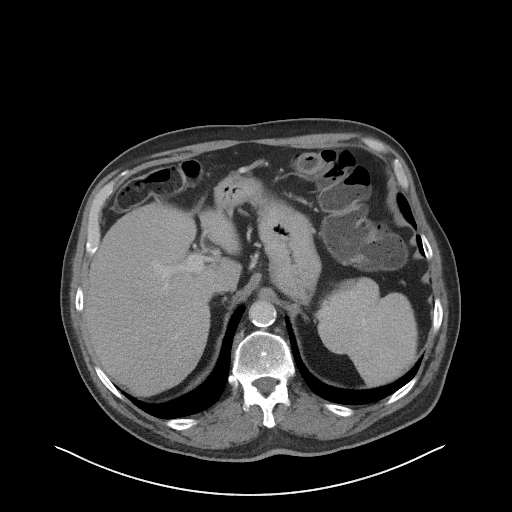
[im 115/121  soft-tissue]
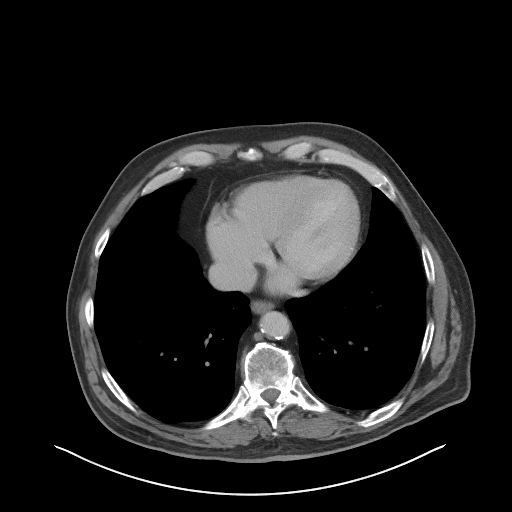

[Series 4: coronal st · coronal · 0.84mm/px · 3 of 108 slices shown]
[im 36/108  soft-tissue]
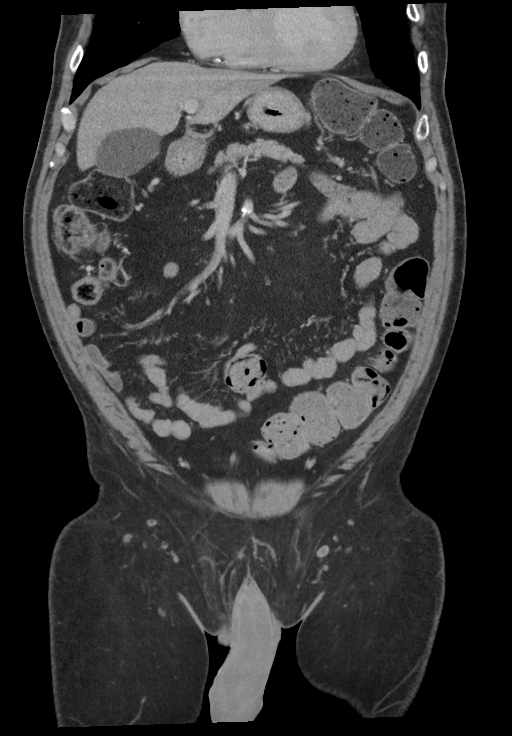
[im 48/108  soft-tissue]
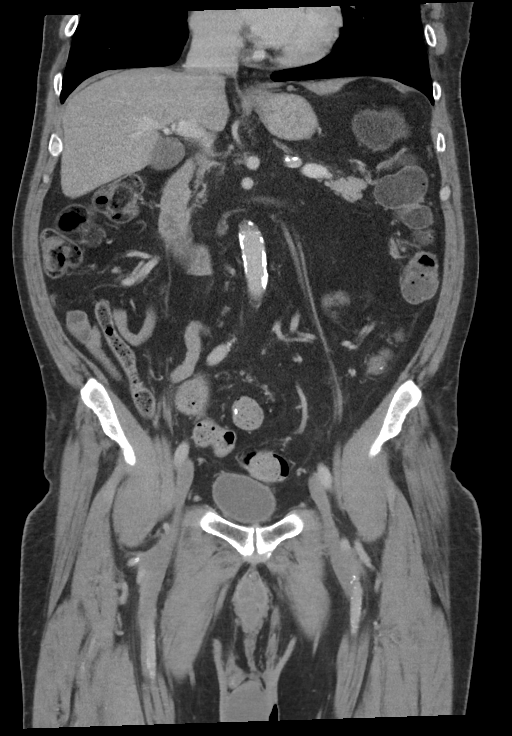
[im 60/108  soft-tissue]
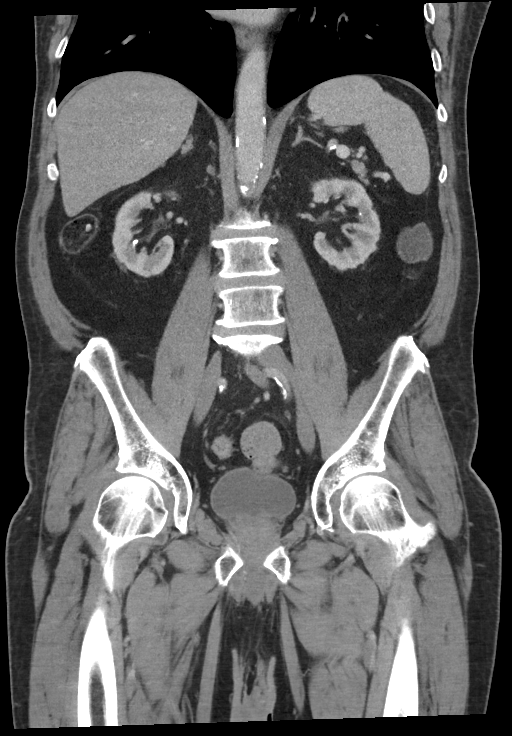

[15 of 46 positions shown; findings below may reference images not displayed]

RADIATION DOSE REDUCTION: This exam was performed according to the
departmental dose-optimization program which includes automated
exposure control, adjustment of the mA and/or kV according to
patient size and/or use of iterative reconstruction technique.

CONTRAST:  100mL OMNIPAQUE IOHEXOL 300 MG/ML  SOLN
FINDINGS: Lower chest: Unremarkable

Hepatobiliary: No suspicious focal abnormality within the liver
parenchyma. There is no evidence for gallstones, gallbladder wall
thickening, or pericholecystic fluid. No intrahepatic or
extrahepatic biliary dilation.

Pancreas: No focal mass lesion. No dilatation of the main duct. No
intraparenchymal cyst. No peripancreatic edema.

Spleen: No splenomegaly. No focal mass lesion.

Adrenals/Urinary Tract: Insert normal thickening of the right
adrenal gland is similar to prior. Left adrenal gland unremarkable.
Cortical scarring noted in both kidneys with tiny nonobstructing
bilateral nephrolithiasis. Tiny hypodensities in both kidneys are
too small to characterize but likely benign. No evidence for
hydroureter. The urinary bladder appears normal for the degree of
distention.

Stomach/Bowel: Stomach is unremarkable. No gastric wall thickening.
No evidence of outlet obstruction. Duodenum is normally positioned
as is the ligament of Treitz. No small bowel wall thickening. No
small bowel dilatation. The terminal ileum is normal. The appendix
is normal. No gross colonic mass. No colonic wall thickening.

Vascular/Lymphatic: There is advanced atherosclerotic calcification
of the abdominal aorta without aneurysm. There is no gastrohepatic
or hepatoduodenal ligament lymphadenopathy. No retroperitoneal or
mesenteric lymphadenopathy. No pelvic sidewall lymphadenopathy.

Reproductive: The prostate gland and seminal vesicles are
unremarkable.

Other: No intraperitoneal free fluid.

Musculoskeletal: No worrisome lytic or sclerotic osseous
abnormality.
IMPRESSION: 1. No acute findings in the abdomen or pelvis. Specifically, no
findings to explain the patient's history of right lower quadrant
pain.
2. Tiny nonobstructing bilateral renal stones. No ureteral or
bladder stones. No secondary changes in either kidney or ureter.
3. Aortic Atherosclerosis ([id number]).

## 2021-03-08 MED ORDER — MORPHINE SULFATE (PF) 4 MG/ML IV SOLN
4.0000 mg | Freq: Once | INTRAVENOUS | Status: AC
  Administered 2021-03-08: 4 mg via INTRAVENOUS
  Filled 2021-03-08: qty 1

## 2021-03-08 MED ORDER — IOHEXOL 300 MG/ML  SOLN
100.0000 mL | Freq: Once | INTRAMUSCULAR | Status: AC | PRN
  Administered 2021-03-08: 100 mL via INTRAVENOUS

## 2021-03-08 NOTE — ED Notes (Signed)
Notified CT that we have a working IV in place

## 2021-03-08 NOTE — ED Triage Notes (Signed)
Pt to ED via POV from home. Pt reports RLQ pain and right leg pain that started Friday. Pt states pain eased off but this morning came back worse. Pt denies any new urinary symptoms. Pt denies CP or SOB.

## 2021-03-08 NOTE — Discharge Instructions (Signed)
The CAT scan of your abdomen did not show any thing to explain your pain.  You can take Tylenol 1 g every 8 hours for the pain.  If your pain is worsening or you develop fevers vomiting or any other new symptoms that are concerning to you, please return to the emergency department.

## 2021-03-08 NOTE — ED Provider Notes (Addendum)
[facility] Provider Note    Event Date/Time   First MD Initiated Contact with Patient 03/08/21 [phone]     (approximate)   History   Abdominal Pain (RLQ)   HPI  James Gardner is a 71 y.o. male past medical history of atrial fibrillation, on Eliquis, CAD, CHF, hypertension hyperlipidemia presents with abdominal pain.  Symptoms started 2 days ago.  Pain is located lower in the right lower quadrant radiating to the right upper leg and groin.  Denies any new urinary symptoms, has urinary urgency and frequency at baseline.  No hematuria.  Pain was intermittent but is now constant.  Denies any upper back pain.  Denies fevers chills nausea vomiting.  No diarrhea.  He has chronic constipation.  Has a history of kidney stones.    Past Medical History:  Diagnosis Date   Cellulitis and abscess of left lower extremity    CHF (congestive heart failure) ([place])    Coronary artery disease    Gout    Hyperlipidemia    Hypertension    Stroke ([place])     There are no problems to display for this patient.    Physical Exam  Triage Vital Signs: ED Triage Vitals  Enc Vitals Group     BP 03/08/21 0843 109/67     Pulse Rate 03/08/21 0843 61     Resp 03/08/21 0843 18     Temp 03/08/21 0843 98.1 F (36.7 C)     Temp Source 03/08/21 0843 Oral     SpO2 03/08/21 0843 100 %     Weight 03/08/21 0840 259 lb 0.7 oz (117.5 kg)     Height 03/08/21 0840 6\' 2"  (1.88 m)     Head Circumference --      Peak Flow --      Pain Score 03/08/21 0840 10     Pain Loc --      Pain Edu? --      Excl. in [place]? --     Most recent vital signs: Vitals:   03/08/21 0843 03/08/21 1230  BP: 109/67 110/68  Pulse: 61 (!) 59  Resp: 18 17  Temp: 98.1 F (36.7 C)   SpO2: 100% 99%     General: Awake, no distress.  CV:  Good peripheral perfusion.  Resp:  Normal effort.  Abd:  No distention.  Tenderness to palpation low down in the right lower quadrant Neuro:             Awake, Alert,  Oriented x 3  Other:  Mild tenderness in the right inguinal region, no significant scrotal tenderness swelling or edema   ED Results / Procedures / Treatments  Labs (all labs ordered are listed, but only abnormal results are displayed) Labs Reviewed  COMPREHENSIVE METABOLIC PANEL - Abnormal; Notable for the following components:      Result Value   Sodium 133 (*)    Potassium 5.5 (*)    Creatinine, Ser 1.29 (*)    Calcium 8.8 (*)    Albumin 3.4 (*)    Total Bilirubin 1.4 (*)    GFR, Estimated 59 (*)    All other components within normal limits  CBC WITH DIFFERENTIAL/PLATELET - Abnormal; Notable for the following components:   Platelets 120 (*)    All other components within normal limits  URINALYSIS, COMPLETE (UACMP) WITH MICROSCOPIC - Abnormal; Notable for the following components:   Color, Urine AMBER (*)    APPearance HAZY (*)  All other components within normal limits  LIPASE, BLOOD - Abnormal; Notable for the following components:   Lipase 57 (*)    All other components within normal limits     EKG     RADIOLOGY I reviewed the CT of the abdomen pelvis which does not show any acute abnormality   PROCEDURES:  Critical Care performed: No  .1-3 Lead EKG Interpretation Performed by: Rada Hay, MD Authorized by: Rada Hay, MD     Interpretation: normal     ECG rate assessment: normal     Ectopy: none     Conduction: normal    The patient is on the cardiac monitor to evaluate for evidence of arrhythmia and/or significant heart rate changes.   MEDICATIONS ORDERED IN ED: Medications  morphine (PF) 4 MG/ML injection 4 mg (4 mg Intravenous Given 03/08/21 0956)  iohexol (OMNIPAQUE) 300 MG/ML solution 100 mL (100 mLs Intravenous Contrast Given 03/08/21 1038)     IMPRESSION / MDM / ASSESSMENT AND PLAN / ED COURSE  I reviewed the triage vital signs and the nursing notes.                              Differential diagnosis includes, but is  not limited to, appendicitis, kidney stone, colitis, enteritis  Patient is a 71 year old male presenting with right lower quadrant pain x2 days.  Pain radiates to the groin.  On exam he overall appears well.  He is tender in the right lower quadrant somewhat lower down than I would expect for typical appendicitis and does have some inguinal tenderness but no obvious abnormalities of his scrotum and no obvious hernia.  Differential primarily is kidney stone versus appendicitis.  We will obtain a CT abdomen pelvis with contrast as I am most concerned for appendicitis.  We will treat his pain and obtain labs and a UA.  Patient's labs are reassuring.  He has no leukocytosis, bilirubin is just mildly elevated at 1.5 but the rest of his LFTs and lipase are within normal limits.  Potassium is 5.5 but this is hemolyzed, patient's renal function is at baseline I do not have any reason to believe that he would be truly hyperkalemic.  CT abdomen pelvis is essentially unremarkable.  No acute intra-abdominal findings.  Overall not sure exactly what is causing the patient's pain but given his negative CT and otherwise reassuring work-up I think he is appropriate for discharge.  He will follow-up with his PCP this week.      FINAL CLINICAL IMPRESSION(S) / ED DIAGNOSES   Final diagnoses:  Right lower quadrant abdominal pain     Rx / DC Orders   ED Discharge Orders     None        Note:  This document was prepared using Dragon voice recognition software and may include unintentional dictation errors.   Rada Hay, MD 03/08/21 1338    Rada Hay, MD 03/08/21 [phone]

## 2022-07-28 ENCOUNTER — Other Ambulatory Visit: Payer: Self-pay | Admitting: Family Medicine

## 2022-07-28 ENCOUNTER — Ambulatory Visit
Admission: RE | Admit: 2022-07-28 | Discharge: 2022-07-28 | Disposition: A | Discharge status: Home / Self Care | Payer: Medicare HMO | Source: Ambulatory Visit | Attending: Family Medicine | Admitting: Family Medicine

## 2022-07-28 DIAGNOSIS — N200 Calculus of kidney: Secondary | ICD-10-CM | POA: Diagnosis present

## 2022-11-26 ENCOUNTER — Other Ambulatory Visit: Payer: Self-pay | Admitting: Family Medicine

## 2022-11-26 ENCOUNTER — Ambulatory Visit
Admission: RE | Admit: 2022-11-26 | Discharge: 2022-11-26 | Disposition: A | Discharge status: Home / Self Care | Payer: Medicare HMO | Source: Ambulatory Visit | Attending: Family Medicine | Admitting: Family Medicine

## 2022-11-26 DIAGNOSIS — R4701 Aphasia: Secondary | ICD-10-CM | POA: Insufficient documentation

## 2022-11-26 DIAGNOSIS — I480 Paroxysmal atrial fibrillation: Secondary | ICD-10-CM

## 2022-11-26 DIAGNOSIS — Z7901 Long term (current) use of anticoagulants: Secondary | ICD-10-CM | POA: Diagnosis not present

## 2022-11-26 DIAGNOSIS — Z8673 Personal history of transient ischemic attack (TIA), and cerebral infarction without residual deficits: Secondary | ICD-10-CM | POA: Insufficient documentation

## 2022-12-16 ENCOUNTER — Emergency Department
Admission: EM | Admit: 2022-12-16 | Discharge: 2022-12-16 | Disposition: A | Discharge status: Home / Self Care | Payer: Medicare HMO | Attending: Emergency Medicine | Admitting: Emergency Medicine

## 2022-12-16 ENCOUNTER — Emergency Department: Payer: Medicare HMO

## 2022-12-16 ENCOUNTER — Encounter: Payer: Self-pay | Admitting: Emergency Medicine

## 2022-12-16 ENCOUNTER — Other Ambulatory Visit: Payer: Self-pay

## 2022-12-16 DIAGNOSIS — I509 Heart failure, unspecified: Secondary | ICD-10-CM | POA: Insufficient documentation

## 2022-12-16 DIAGNOSIS — I2510 Atherosclerotic heart disease of native coronary artery without angina pectoris: Secondary | ICD-10-CM | POA: Insufficient documentation

## 2022-12-16 DIAGNOSIS — I110 Hypertensive heart disease with heart failure: Secondary | ICD-10-CM | POA: Diagnosis not present

## 2022-12-16 DIAGNOSIS — Z7901 Long term (current) use of anticoagulants: Secondary | ICD-10-CM | POA: Insufficient documentation

## 2022-12-16 DIAGNOSIS — R079 Chest pain, unspecified: Secondary | ICD-10-CM

## 2022-12-16 DIAGNOSIS — R0789 Other chest pain: Secondary | ICD-10-CM | POA: Insufficient documentation

## 2022-12-16 LAB — CBC
HCT: 41.8 % (ref 39.0–52.0)
Hemoglobin: 14 g/dL (ref 13.0–17.0)
MCH: 31.5 pg (ref 26.0–34.0)
MCHC: 33.5 g/dL (ref 30.0–36.0)
MCV: 93.9 fL (ref 80.0–100.0)
Platelets: 100 10*3/uL — ABNORMAL LOW (ref 150–400)
RBC: 4.45 MIL/uL (ref 4.22–5.81)
RDW: 13 % (ref 11.5–15.5)
WBC: 5.6 10*3/uL (ref 4.0–10.5)
nRBC: 0 % (ref 0.0–0.2)

## 2022-12-16 LAB — BASIC METABOLIC PANEL
Anion gap: 6 (ref 5–15)
BUN: 19 mg/dL (ref 8–23)
CO2: 25 mmol/L (ref 22–32)
Calcium: 8.6 mg/dL — ABNORMAL LOW (ref 8.9–10.3)
Chloride: 106 mmol/L (ref 98–111)
Creatinine, Ser: 1.21 mg/dL (ref 0.61–1.24)
GFR, Estimated: >60 mL/min (ref >60)
Glucose, Bld: 94 mg/dL (ref 70–99)
Potassium: 4.2 mmol/L (ref 3.5–5.1)
Sodium: 137 mmol/L (ref 135–145)

## 2022-12-16 LAB — TROPONIN I (HIGH SENSITIVITY)
Troponin I (High Sensitivity): 3 ng/L (ref <18)
Troponin I (High Sensitivity): 4 ng/L (ref <18)

## 2022-12-16 NOTE — ED Triage Notes (Signed)
Patient to ED via POV for Chest pain that started while work. Pain on left side and sharp. Hx of CHF.

## 2022-12-16 NOTE — ED Notes (Signed)
Resumed care from billy rn.  Pt alert. No chest pain or sob.  Pt in hallway bed.

## 2022-12-16 NOTE — Discharge Instructions (Signed)

## 2022-12-16 NOTE — ED Provider Notes (Signed)
[facility] Provider Note    Event Date/Time   First MD Initiated Contact with Patient 12/16/22 1424     (approximate)   History   Chest Pain   HPI  James Gardner is a 72 y.o. male history of hypertension hyperlipidemia coronary disease and CHF.  Also notation of paroxysmal atrial fibrillation   Patient reports that at work earlier today started experiencing a discomfort in the left side of his chest.  Somewhat hard to describe.  He reports a history of cardiac disease and stenting.  He is also anticoagulated  Currently reports he feels well, in no acute distress.  Reports a history of previous cardiac disease follows with cardiology  Patient taking apixaban.  No shortness of breath  Physical Exam   Triage Vital Signs: ED Triage Vitals  Encounter Vitals Group     BP 12/16/22 1146 (!) 148/78     Systolic BP Percentile --      Diastolic BP Percentile --      Pulse Rate 12/16/22 1146 70     Resp 12/16/22 1146 18     Temp 12/16/22 1146 (!) 97.5 F (36.4 C)     Temp Source 12/16/22 1146 Oral     SpO2 12/16/22 1146 100 %     Weight 12/16/22 1147 259 lb 0.7 oz (117.5 kg)     Height 12/16/22 1147 6\' 2"  (1.88 m)     Head Circumference --      Peak Flow --      Pain Score 12/16/22 1147 3     Pain Loc --      Pain Education --      Exclude from Growth Chart --     Most recent vital signs: Vitals:   12/16/22 1146 12/16/22 1516  BP: (!) 148/78 (!) 147/77  Pulse: 70 61  Resp: 18 18  Temp: (!) 97.5 F (36.4 C)   SpO2: 100% 99%     General: Awake, no distress.  Upright in no distress very pleasant CV:  Good peripheral perfusion.  Normal Tones no accelerated rhythm Resp:  Normal effort.  Clear bilateral Abd:  No distention.  Other:     ED Results / Procedures / Treatments   Labs (all labs ordered are listed, but only abnormal results are displayed) Labs Reviewed  BASIC METABOLIC PANEL - Abnormal; Notable for the following components:       Result Value   Calcium 8.6 (*)    All other components within normal limits  CBC - Abnormal; Notable for the following components:   Platelets 100 (*)    All other components within normal limits  TROPONIN I (HIGH SENSITIVITY)  TROPONIN I (HIGH SENSITIVITY)     EKG  EKG interpreted at 1150 No evidence of acute ischemia, very mild nonspecific T wave abnormality.  No STEMI.     RADIOLOGY Chest x-ray interpreted as negative for acute finding by me    PROCEDURES:  Critical Care performed: No  Procedures   MEDICATIONS ORDERED IN ED: Medications - No data to display   IMPRESSION / MDM / ASSESSMENT AND PLAN / ED COURSE  I reviewed the triage vital signs and the nursing notes.                              Differential diagnosis includes, but is not limited to, ACS, aortic dissection, pulmonary embolism, cardiac tamponade, pneumothorax, pneumonia, pericarditis, myocarditis, GI-related  causes including esophagitis/gastritis, and musculoskeletal chest wall pain.    Reassuring ECG.  Troponin 1 and troponin 2 are both normal.  CBC and metabolic panel are normal.  He has normal vital signs with exception to very mild hypertension.  No hypoxia no tachycardia, and his workup and clinical symptoms are quite reassuring.  He does of course have a history of cardiac disease but I do not see evidence of acute ACS at this time.  He is also actively anticoagulated lowering risk of thromboembolism significantly.  His chest x-ray is clear there is no ripping tearing or moving pain no signs or symptoms of be suggestive of acute dissection pneumothorax pneumonia infectious cause etc.  Cause somewhat unclear but I suspect this is unlikely cardiac or acute emergent cause for chest pain.  He is able to follow-up with cardiology, and comfortable plan  Patient's presentation is most consistent with acute complicated illness / injury requiring diagnostic workup.      Per [facility] cardiology note  12/09/22 Because of lower LVEF 10/14/2021, had repeat stress and LHC in 03/2021. LHC 04/06/21 with unchanged CAD and patent LAD stent   FINAL CLINICAL IMPRESSION(S) / ED DIAGNOSES   Final diagnoses:  Chest pain with low risk for cardiac etiology     Rx / DC Orders   ED Discharge Orders          Ordered    Ambulatory referral to Cardiology       Comments: [facility] Cardiology (established patient), follow-up for chest pain   12/16/22 1517             Note:  This document was prepared using Dragon voice recognition software and may include unintentional dictation errors.   Sharyn Creamer, MD 12/29/22 2150

## 2023-01-17 ENCOUNTER — Other Ambulatory Visit: Payer: Self-pay | Admitting: Family Medicine

## 2023-01-17 ENCOUNTER — Ambulatory Visit
Admission: RE | Admit: 2023-01-17 | Discharge: 2023-01-17 | Disposition: A | Discharge status: Home / Self Care | Payer: Medicare HMO | Attending: Family Medicine | Admitting: Family Medicine

## 2023-01-17 ENCOUNTER — Ambulatory Visit
Admission: RE | Admit: 2023-01-17 | Discharge: 2023-01-17 | Disposition: A | Discharge status: Home / Self Care | Payer: Medicare HMO | Source: Ambulatory Visit | Attending: Family Medicine | Admitting: Family Medicine

## 2023-01-17 DIAGNOSIS — B349 Viral infection, unspecified: Secondary | ICD-10-CM
